# Patient Record
Sex: Male | Born: 1950 | Race: White | Hispanic: No | Marital: Married | Smoking: Never smoker
Health system: Southern US, Community
[De-identification: ages and names within clinical notes are randomized; demographics above are authoritative.]

## PROBLEM LIST (undated history)

## (undated) DIAGNOSIS — G039 Meningitis, unspecified: Secondary | ICD-10-CM

## (undated) DIAGNOSIS — I679 Cerebrovascular disease, unspecified: Secondary | ICD-10-CM

## (undated) DIAGNOSIS — Z860101 Personal history of adenomatous and serrated colon polyps: Secondary | ICD-10-CM

## (undated) DIAGNOSIS — Z86010 Personal history of colonic polyps: Secondary | ICD-10-CM

## (undated) DIAGNOSIS — R6884 Jaw pain: Secondary | ICD-10-CM

## (undated) DIAGNOSIS — E785 Hyperlipidemia, unspecified: Secondary | ICD-10-CM

## (undated) DIAGNOSIS — H9190 Unspecified hearing loss, unspecified ear: Secondary | ICD-10-CM

## (undated) DIAGNOSIS — E669 Obesity, unspecified: Secondary | ICD-10-CM

## (undated) DIAGNOSIS — E119 Type 2 diabetes mellitus without complications: Secondary | ICD-10-CM

## (undated) DIAGNOSIS — I10 Essential (primary) hypertension: Secondary | ICD-10-CM

## (undated) DIAGNOSIS — M47816 Spondylosis without myelopathy or radiculopathy, lumbar region: Secondary | ICD-10-CM

## (undated) HISTORY — DX: Obesity, unspecified: E66.9

## (undated) HISTORY — DX: Unspecified hearing loss, unspecified ear: H91.90

## (undated) HISTORY — DX: Meningitis, unspecified: G03.9

## (undated) HISTORY — DX: Personal history of adenomatous and serrated colon polyps: Z86.0101

## (undated) HISTORY — PX: OTHER SURGICAL HISTORY: SHX169

## (undated) HISTORY — DX: Essential (primary) hypertension: I10

## (undated) HISTORY — DX: Jaw pain: R68.84

## (undated) HISTORY — DX: Cerebrovascular disease, unspecified: I67.9

## (undated) HISTORY — DX: Hyperlipidemia, unspecified: E78.5

## (undated) HISTORY — DX: Personal history of colonic polyps: Z86.010

## (undated) HISTORY — DX: Spondylosis without myelopathy or radiculopathy, lumbar region: M47.816

## (undated) HISTORY — PX: BACK SURGERY: SHX140

## (undated) HISTORY — PX: COLONOSCOPY: SHX5424

---
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
## 1990-09-05 HISTORY — PX: LUMBAR FUSION: SHX111

## 2001-03-02 ENCOUNTER — Encounter: Admission: RE | Admit: 2001-03-02 | Discharge: 2001-03-02 | Payer: Self-pay | Admitting: Family Medicine

## 2001-03-02 ENCOUNTER — Encounter: Payer: Self-pay | Admitting: Family Medicine

## 2001-04-03 ENCOUNTER — Ambulatory Visit (HOSPITAL_COMMUNITY): Admission: RE | Admit: 2001-04-03 | Discharge: 2001-04-03 | Payer: Self-pay | Admitting: Gastroenterology

## 2001-04-03 ENCOUNTER — Encounter (INDEPENDENT_AMBULATORY_CARE_PROVIDER_SITE_OTHER): Payer: Self-pay | Admitting: Specialist

## 2005-01-07 ENCOUNTER — Ambulatory Visit (HOSPITAL_COMMUNITY): Admission: RE | Admit: 2005-01-07 | Discharge: 2005-01-07 | Payer: Self-pay | Admitting: Ophthalmology

## 2005-01-07 ENCOUNTER — Ambulatory Visit (HOSPITAL_BASED_OUTPATIENT_CLINIC_OR_DEPARTMENT_OTHER): Admission: RE | Admit: 2005-01-07 | Discharge: 2005-01-07 | Payer: Self-pay | Admitting: Ophthalmology

## 2005-03-03 ENCOUNTER — Ambulatory Visit (HOSPITAL_BASED_OUTPATIENT_CLINIC_OR_DEPARTMENT_OTHER): Admission: RE | Admit: 2005-03-03 | Discharge: 2005-03-03 | Payer: Self-pay | Admitting: Ophthalmology

## 2005-03-03 ENCOUNTER — Ambulatory Visit (HOSPITAL_COMMUNITY): Admission: RE | Admit: 2005-03-03 | Discharge: 2005-03-03 | Payer: Self-pay | Admitting: Ophthalmology

## 2007-02-05 ENCOUNTER — Ambulatory Visit (HOSPITAL_COMMUNITY): Admission: RE | Admit: 2007-02-05 | Discharge: 2007-02-05 | Payer: Self-pay | Admitting: Family Medicine

## 2009-04-17 HISTORY — PX: LUMBAR FUSION: SHX111

## 2011-01-21 NOTE — Op Note (Signed)
[name], [name] NO.:  [id number]   MEDICAL RECORD NO.:  [id number]          PATIENT TYPE:  AMB   LOCATION:  DSC                          FACILITY:  [facility]   PHYSICIAN:  Pasty Spillers. Maple Hudson, M.D. 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   PROCEDURE:  1.  Left medial rectus muscle recession, 5.0 mm, with full tendon width up      shift.  2.  Left lateral rectus muscle resection, 4.0 mm, with full tendon width up      shift.   SURGEON:  Pasty Spillers. Maple Hudson, M.D.   ANESTHESIA:  General (laryngeal mask).   COMPLICATIONS:  None.   PROCEDURE:  After preop evaluation including informed consent, the patient  was taken the operating room where he was identified by me. General  anesthesia was induced without difficulty after placement of appropriate  monitors. The the patient was prepped and draped in standard sterile  fashion. Lid speculum was placed in the left eye. Conjunctiva was inspected  and did not show signs of scarring from previous surgery. Thus it was  concluded that the previous surgery had (as the patient believed) been  confined to the right eye.   Through an inferonasal fornix incision through conjunctiva and Tenon's  fascia, right medial rectus muscle was engaged on a series of muscle hooks  and cleared of its fascial attachments. The tendon was secured with a double-  arm 6-0 Vicryl suture, with a double-locking bite at each border of the  muscle, 1 mm from the insertion. The muscle was disinserted. Its inferior  pole was reattached to the sclera 5.0 mm posterior to the superior pole of  the muscle stump. The superior pole was  reattached to sclera approximately 8  mm above the inferior pole. Both sutures were attached in crossed swords  fashion and tied securely. The sutures ends were removed after the position  of muscle had been checked and found to be accurate. The conjunctiva was  closed with two interrupted 6-0 plain gut sutures. Through an inferonasal  temporal fornix incision through conjunctiva and Tenon's fascia, the left  lateral rectus muscle was engaged on a series of muscle hooks carefully  cleared of its fascial attachments. The muscle was spread between two self-  retaining hooks. A 2 mm bite was taken of the center of muscle belly.  It  measured distance of 4.0 mm posterior to the insertion, the knot was tied  securely at this location. The needle at each end of the double-arm suture  was passed from the center of muscle belly to the periphery, parallel to and  4.0 mm posterior to the insertion. Double-locking bite was placed at each  border of the muscle. A resection clamp was placed on the muscle just  anterior to the suture. The muscle was disinserted. The inferior pole suture  was passed posteriorly to anteriorly through the superior end of the muscle  stump, then anteriorly to posteriorly approximately 3 mm beyond the end of  the stump, then posteriorly to anteriorly through the center of the muscle  belly, just posterior to the previously placed knot. The superior pole of  the suture was similarly passed, beginning approximately 8 mm above the  extended end of the original muscle stump. The muscle was drawn up to the  level of the original insertion, and all slack was removed in both the pole  sutures before the suture ends were tied securely. The portion of the muscle  anterior to the sutures was carefully excised after the resection clamp had  been removed. Conjunctiva was closed with two 6-0 plain gut sutures.  TobraDex ointment was placed in the eye. The patient was awaken without   difficulty and taken to recovery room in stable condition, having suffered  no intraoperative or immediate postop complications.      WOY/MEDQ  D:  01/07/2005  T:  01/07/2005  Job:  [id number]   cc:   Loraine Leriche T. Nile Riggs, M.D.  Fax: [phone]

## 2011-01-21 NOTE — Procedures (Signed)
[facility]  Patient:    [name]                          MRN: [id number] Proc. Date: 04/03/01 Adm. Date:  [id number] Attending:  Orland Mustard CC:         Teena Irani. Arlyce Dice, M.D.   Procedure Report  PROCEDURE:  Colonoscopy with coagulation of polyp.  SURGEON:  James L. Edwards, M.D.  MEDICATIONS:  Fentanyl 50 mcg and Versed 6 mg IV.  SCOPE:  Adult Olympus video colonoscope.  INDICATIONS:  Strong family history of colon cancer in a brother.  DESCRIPTION OF PROCEDURE:  The procedure had been explained to the patient and consent obtained.  With the patient in the left lateral decubitus position, the adult Olympus video colonoscope was inserted and advanced under direct visualization.  The prep was excellent.  We were able to advance easily to the cecum.  The scope was withdrawn and the cecum, ascending colon, hepatic flexure were seen well.  In the descending colon, a 3 mm polyp was seen, and was cauterized with the hot biopsy forceps.  There was no other polyp seen throughout the colon.  There was no significant diverticular disease.  The scope was withdrawn and there were some internal hemorrhoids seen in the rectum.  No other abnormalities were seen.  The patient tolerated the procedure and was maintained on low flow oxygen and pulse oximeter throughout the procedure.  ASSESSMENT: 1. Ascending colon polyp, cauterized. 2. Internal hemorrhoids.  PLAN:  Will check pathology and if adenomatous, would recommend repeating in three years.  If not, would recommend repeating in five years due to his family history. DD:  04/03/01 TD:  04/03/01 Job: [id number]

## 2011-01-21 NOTE — Op Note (Signed)
NAMEFREMON, ZACHARIA NO.:  [id number]   MEDICAL RECORD NO.:  [id number]          PATIENT TYPE:  AMB   LOCATION:  DSC                          FACILITY:  [facility]   PHYSICIAN:  Pasty Spillers. Maple Hudson, M.D. DATE OF BIRTH:  19-Nov-1950   DATE OF PROCEDURE:  03/03/2005  DATE OF DISCHARGE:  03/03/2005                                 OPERATIVE REPORT   PREOPERATIVE DIAGNOSES:  1.  Consecutive exotropia, following left medial rectus muscle recession and      left lateral rectus muscle resection, with vertical offset for      hypertropia.  2.  Status post four previous eye muscle surgeries, right eye, details      unknown.   POSTOPERATIVE DIAGNOSES:  1.  Consecutive exotropia, following left medial rectus muscle recession and      left lateral rectus muscle resection, with vertical offset for      hypertropia.  2.  Status post four previous eye muscle surgeries, right eye, details      unknown.   OPERATION PERFORMED:  1.  Exploration of right lateral rectus muscle.  2.  Exploration of right medial rectus muscle.  3.  Repair of split right medial rectus muscle.  4.  Right medial rectus muscle resection, 8.0 mm, with 3.0 mm adjustable      recession.   SURGEON:  Pasty Spillers. Maple Hudson, M.D.   ANESTHESIA:  General laryngeal mask.   COMPLICATIONS:  None.   DESCRIPTION OF PROCEDURE:  After routine preoperative evaluation including  informed consent for eye muscle surgery on the right eye, the patient was  taken to the operating room where he was identified by me.  General  anesthesia was induced without difficulty after placement of appropriate  monitors.  The patient was prepped and draped in standard sterile fashion.  A lid speculum was placed in the right eye.   A traction suture of 6-0 silk was placed at the superior and inferior limbus  of the right eye, and this was used to draw the eye nasally.  Limbal  conjunctival peritomy of 2 o'clock hours extent was made temporally  in the  right eye with Westcott scissors, with relaxing incisions in the  supratemporal and infratemporal quadrant.  Dissection was carried out into  the supratemporal and infratemporal quadrants.  Exploration of the right  lateral rectus muscle was carried out.  Extensive scar tissue was  encountered, and the right inferior oblique muscle was identified, but the  right lateral rectus muscle was not clearly identified, even after extensive  exploration posteriorly, superiorly and inferiorly.  There was concern that  the lateral rectus muscle might have been severed in the dissection through  scar tissue.  Therefore it was elected to awaken the patient enough to have  him follow commands, to verify that he could abduct the right eye,  indicating function of the right lateral rectus muscle.  This was done, and  the patient was in fact able to abduct the right eye.  Anesthesia was  deepened, and the patient was re-draped.  The conjunctiva of  the right eye  was closed with 6-0 plain gut sutures, after resecting an area of  conjunctival scar from previous surgeries, leaving the conjunctiva in effect  recessed approximately 5 mm posterior to the limbus.  Note again that no  surgery was carried out on the right lateral rectus muscle itself.  The eye  was then drawn temporally, and a limbal conjunctival peritomy was made  nasally to explore the right medial rectus muscle as the plan was now to  resect the medial rectus muscle as opposed to recessing the lateral.  The  medial rectus muscle was engaged on a series of muscle hooks and cleared of  its surrounding fascial attachments and scar tissue.  The inferior two  thirds of the muscle was found attached approximately 5 mm posterior to the  limbus; how the superior third of the muscle was found attached  approximately 10 mm posterior to the limbus.  This portion was carefully  explored, cleared to verify that it was in fact, part of the medial  rectus  muscle.  This portion itself was quite tight, and could not be advanced to  the original insertion without it causing excessive tension.  It was  therefore elected to leave this portion 5 mm posterior to the remainder of  the muscle, and to joint the two portions of the muscle with 6-0 Vicryl  sutures.  A knot was placed through the medial rectus muscles, joining the  two sections, 8 mm posterior to the insertion of the more anterior part,  i.e. 13 mm posterior to the limbus.  The needle at each end of the double  armed suture was passed from the center of the muscle belly to the  periphery, parallel to the insertion, again 13 mm posterior to the limbus.  Double locking bite was placed at each border of the muscle. A resection  clamp was placed just anterior to these sutures.  Both portions of the  medial rectus muscle were then disinserted.  Each pole suture of the medial  rectus muscle was passed into sclera in cross swords fashion at the level of  the original insertion (i.e. approximately 5 mm posterior to the limbus).  The muscle was drawn up to the level of the original insertion and the pole  sutures were tied together approximately 10 cm above sclera.  The pole  sutures were then clamped together with a needle driver at a measured  distance of 3 mm above sclera.  A noose suture was tied around the pole  sutures at this level.  The medial rectus muscle was then allowed to hang  back 3 mm, held at this level by the noose suture which was now in contact  with sclera.  Conjunctiva was reapposed with multiple 6-0 plain gut sutures,  leaving conjunctiva recessed approximately 5 mm posterior to the limbus.  The traction suture of 6-0 silk was removed from the superior and inferior  limbus and placed at the nasal limbus.  The pole, noose, and traction  sutures were taped to the right cheek.  Tobradex ointment was placed in the right eye.  The patch was placed over the right eye.   The patient was  awakened without difficulty and taken to the recovery room in stable  condition, having suffered no intraoperative or immediate postoperative  complications.       WOY/MEDQ  D:  03/07/2005  T:  03/07/2005  Job:  [id number]

## 2011-08-05 ENCOUNTER — Telehealth: Payer: Self-pay | Admitting: Nurse Practitioner

## 2011-08-05 ENCOUNTER — Encounter: Payer: Self-pay | Admitting: Nurse Practitioner

## 2011-08-08 ENCOUNTER — Ambulatory Visit (INDEPENDENT_AMBULATORY_CARE_PROVIDER_SITE_OTHER): Payer: BC Managed Care – PPO | Admitting: Nurse Practitioner

## 2011-08-08 ENCOUNTER — Encounter: Payer: Self-pay | Admitting: Nurse Practitioner

## 2011-08-08 DIAGNOSIS — R079 Chest pain, unspecified: Secondary | ICD-10-CM

## 2011-08-08 DIAGNOSIS — Z9189 Other specified personal risk factors, not elsewhere classified: Secondary | ICD-10-CM

## 2011-08-08 DIAGNOSIS — R6884 Jaw pain: Secondary | ICD-10-CM | POA: Insufficient documentation

## 2011-08-08 NOTE — Patient Instructions (Addendum)
We are going to arrange for a stress test.   Exercise/diet/weight loss is encouraged.  Call the [facility] office at [phone] if you have any questions, problems or concerns.   I will have Dr. Peter Swaziland be your cardiologist.  We will see you back as needed.

## 2011-08-08 NOTE — Assessment & Plan Note (Signed)
He has multiple CV risk factors which include gender, family history, HTN, DM, and hyperlipidemia. I have encouraged him to try and increase his exercise program to 5 to 7 days per week. I think he would benefit greatly from weight loss. His blood pressure is currently controlled. We will be available as needed. I will have Dr. Swaziland follow him for cardiology care. Patient is agreeable to this plan and will call if any problems develop in the interim.

## 2011-08-08 NOTE — Assessment & Plan Note (Signed)
He is not having any exertional symptoms. EKG is normal. Does have multiple CV risk factors. We will proceed with repeat stress testing.

## 2011-08-08 NOTE — Progress Notes (Signed)
   Larwance Rote Date of Birth: [birth year] Medical Record #[id number]  History of Present Illness: Jonni Sanger is seen today at the request of Dr. Izola Price. He is a former patient of Dr. Ronnald Nian. He has had remote stress testing. His cardiac risk factors include gender, family history, HTN, hyperlipidemia and diabetes. He is obese. BMI is 32. He reports having maybe 2 or 3 episodes of chest burning. It happens about once a month. It is not exertional in nature. He notes that it comes on with stress and anxiety. No aggravating factors. No relieving factors. No other associated symptoms i.e. SOB, N, V, sweating or radiation. He does work out at [employer] about 2 to 3 x per week without any problems. His last stress test was about 2 years ago.   Current Outpatient Prescriptions on File Prior to Visit  Medication Sig Dispense Refill  . amLODipine (NORVASC) 5 MG tablet Take 5 mg by mouth daily.        Marland Kitchen aspirin 81 MG tablet Take 81 mg by mouth daily.        . cholecalciferol (VITAMIN D) 1000 UNITS tablet Take 1,000 Units by mouth daily.        . Multiple Vitamin (MULTIVITAMIN) tablet Take 1 tablet by mouth daily.        . S-Adenosylmethionine (SAM-E) 400 MG TABS Take 1 tablet by mouth daily.        . simvastatin (ZOCOR) 20 MG tablet Take 20 mg by mouth at bedtime.          No Known Allergies  Past Medical History  Diagnosis Date  . Hyperlipidemia   . Hypertension   . Vitamin D deficiency   . Prediabetes   . History of adenomatous polyp of colon   . Obesity   . Lumbar spondylosis   . Decreased hearing     Past Surgical History  Procedure Date  . Back surgery   . Lumbar fusion 04/17/2009    L4-L5  . Lumbar fusion 1992    L4-L5  . Hernia repair 1962  . Eye muscle repair   . Colonoscopy 07/21/2004, 04/03/2010    History  Smoking status  . Never Smoker   Smokeless tobacco  . Not on file    History  Alcohol Use No    Family History  Problem Relation Age of Onset  . Heart failure Mother    . Hypertension Mother   . Hypertension Father   . Breast cancer Sister   . Prostate cancer Brother   . Heart attack Brother 59  . Heart failure Maternal Grandmother   . Heart failure Maternal Grandfather   . Heart failure Paternal Grandmother   . Heart failure Paternal Grandfather     Review of Systems: The review of systems is per the HPI.  All other systems were reviewed and are negative.  Physical Exam: BP 122/82  Pulse 62  Ht 6' (1.829 m)  Wt 238 lb 12.8 oz (108.319 kg)  BMI 32.39 kg/m2 Patient is very pleasant and in no acute distress. He is overweight. Skin is warm and dry. Color is normal.  HEENT is unremarkable. Normocephalic/atraumatic. PERRL. Sclera are nonicteric. Neck is supple. No masses. No JVD. Lungs are clear. Cardiac exam shows a regular rate and rhythm. Abdomen is obese but soft. Extremities are without edema. Gait and ROM are intact. No gross neurologic deficits noted.   LABORATORY DATA: EKG from his primary care is normal.    Assessment / Plan:

## 2011-08-11 ENCOUNTER — Ambulatory Visit (HOSPITAL_COMMUNITY): Payer: BC Managed Care – PPO | Attending: Internal Medicine | Admitting: Radiology

## 2011-08-11 DIAGNOSIS — R079 Chest pain, unspecified: Secondary | ICD-10-CM

## 2011-08-11 DIAGNOSIS — E119 Type 2 diabetes mellitus without complications: Secondary | ICD-10-CM | POA: Insufficient documentation

## 2011-08-11 DIAGNOSIS — Z9189 Other specified personal risk factors, not elsewhere classified: Secondary | ICD-10-CM

## 2011-08-11 DIAGNOSIS — R0789 Other chest pain: Secondary | ICD-10-CM | POA: Insufficient documentation

## 2011-08-11 DIAGNOSIS — Z8249 Family history of ischemic heart disease and other diseases of the circulatory system: Secondary | ICD-10-CM | POA: Insufficient documentation

## 2011-08-11 DIAGNOSIS — R0602 Shortness of breath: Secondary | ICD-10-CM

## 2011-08-11 DIAGNOSIS — R42 Dizziness and giddiness: Secondary | ICD-10-CM | POA: Insufficient documentation

## 2011-08-11 DIAGNOSIS — I10 Essential (primary) hypertension: Secondary | ICD-10-CM | POA: Insufficient documentation

## 2011-08-11 DIAGNOSIS — E785 Hyperlipidemia, unspecified: Secondary | ICD-10-CM | POA: Insufficient documentation

## 2011-08-11 MED ORDER — TECHNETIUM TC 99M TETROFOSMIN IV KIT
33.0000 | PACK | Freq: Once | INTRAVENOUS | Status: AC | PRN
  Administered 2011-08-11: 33 via INTRAVENOUS

## 2011-08-11 MED ORDER — TECHNETIUM TC 99M TETROFOSMIN IV KIT
11.0000 | PACK | Freq: Once | INTRAVENOUS | Status: AC | PRN
  Administered 2011-08-11: 11 via INTRAVENOUS

## 2011-08-11 NOTE — Progress Notes (Signed)
[facility] SITE 3 NUCLEAR MED [address] [place] [address] [phone]  Cardiology Nuclear Med Study  [name] is a 60 y.o. male [id number] [birth year]   Nuclear Med Background Indication for Stress Test:  Evaluation for Ischemia History:  No previous documented CAD and 2 yrs ago Myocardial Perfusion Study: NL (Dr. Deborah Chalk, unable to locate [facility] chart and report) Cardiac Risk Factors: Family History - CAD, Hypertension, Lipids and NIDDM  Symptoms:  Chest Pain (burning) with and without  Exertion (last date of chest discomfort one week ago) and Dizziness   Nuclear Pre-Procedure Caffeine/Decaff Intake:  8:00pm NPO After: 8:00pm   Lungs:  Clear IV 0.9% NS with Angio Cath:  20g  IV Site: R Forearm  IV Started by:  Cathlyn Parsons, RN  Chest Size (in):  46 Cup Size: n/a  Height: 6' (1.829 m)  Weight:  234 lb (106.142 kg)  BMI:  Body mass index is 31.74 kg/(m^2). Tech Comments:  n/a    Nuclear Med Study 1 or 2 day study: 1 day  Stress Test Type:  Stress  Reading MD: Dietrich Pates, MD  Order Authorizing Provider:  Vonna Drafts and Sunday Spillers  Resting Radionuclide: Technetium 13m Tetrofosmin  Resting Radionuclide Dose: 11.0 mCi   Stress Radionuclide:  Technetium 59m Tetrofosmin  Stress Radionuclide Dose: 33.0 mCi           Stress Protocol Rest HR: 74 Stress HR: 144  Rest BP: 112/91 Stress BP: 171/78  Exercise Time (min): 8:45 METS: 10.1   Predicted Max HR: 159 bpm % Max HR: 90.57 bpm Rate Pressure Product: 57846   Dose of Adenosine (mg):  n/a Dose of Lexiscan: n/a mg  Dose of Atropine (mg): n/a Dose of Dobutamine: n/a mcg/kg/min (at max HR)  Stress Test Technologist: Irean Hong, RN  Nuclear Technologist:  Domenic Polite, CNMT     Rest Procedure:  Myocardial perfusion imaging was performed at rest 45 minutes following the intravenous administration of Technetium 99m Tetrofosmin. Rest ECG: NSR with poor R wave progression  Stress  Procedure:  The patient exercised for 8 minutes and 45 seconds, RPE=15.  The patient stopped due to DOE and denied any chest pain.  There were no significant ST-T wave changes. There was a drop in BP to 139/77 immediately post exercise. Technetium 63m Tetrofosmin was injected at peak exercise and myocardial perfusion imaging was performed after a brief delay. Stress ECG: No significant change from baseline ECG  QPS Raw Data Images:  Soft tissue (diaphragm, subcutaneous fat) surround heart.  Stress and rest images were motion corrected. Stress Images:  Normal homogeneous uptake in all areas of the myocardium. Rest Images:  Normal homogeneous uptake in all areas of the myocardium. Subtraction (SDS):  No evidence of ischemia. Transient Ischemic Dilatation (Normal <1.22):  1.05 Lung/Heart Ratio (Normal <0.45):  0.32  Quantitative Gated Spect Images QGS EDV:  98 ml QGS ESV:  37 ml QGS cine images:  NL LV Function; NL Wall Motion QGS EF: 62%  Impression Exercise Capacity:  Good exercise capacity. BP Response:  Normal blood pressure response. Clinical Symptoms:  No chest pain. ECG Impression:  No significant ST segment change suggestive of ischemia. Comparison with Prior Nuclear Study:No change from previous report.  Overall Impression:  Normal stress nuclear study.  LVEF 62%. Dietrich Pates

## 2011-08-12 ENCOUNTER — Telehealth: Payer: Self-pay | Admitting: *Deleted

## 2011-08-12 NOTE — Telephone Encounter (Signed)
Spoke w/wife and gave stress test results. Will send copy to Dr. Izola Price at [place] [facility]

## 2011-08-12 NOTE — Telephone Encounter (Signed)
Message copied by Lorayne Bender on Fri Aug 12, 2011  5:46 PM ------      Message from: [place], PETER M      Created: Fri Aug 12, 2011  1:42 PM       Normal stress myoview. EF is normal. Please report.      Theron Arista [place]

## 2011-12-09 NOTE — Telephone Encounter (Signed)
Close  

## 2013-05-21 DIAGNOSIS — E782 Mixed hyperlipidemia: Secondary | ICD-10-CM | POA: Insufficient documentation

## 2013-05-21 DIAGNOSIS — F411 Generalized anxiety disorder: Secondary | ICD-10-CM

## 2013-05-21 DIAGNOSIS — I10 Essential (primary) hypertension: Secondary | ICD-10-CM

## 2013-05-21 HISTORY — DX: Generalized anxiety disorder: F41.1

## 2013-05-21 HISTORY — DX: Mixed hyperlipidemia: E78.2

## 2013-05-21 HISTORY — DX: Essential (primary) hypertension: I10

## 2014-04-07 DIAGNOSIS — E119 Type 2 diabetes mellitus without complications: Secondary | ICD-10-CM | POA: Insufficient documentation

## 2014-04-07 HISTORY — DX: Type 2 diabetes mellitus without complications: E11.9

## 2014-12-24 ENCOUNTER — Encounter: Payer: Self-pay | Admitting: Interventional Cardiology

## 2014-12-24 ENCOUNTER — Ambulatory Visit (INDEPENDENT_AMBULATORY_CARE_PROVIDER_SITE_OTHER): Payer: BLUE CROSS/BLUE SHIELD | Admitting: Interventional Cardiology

## 2014-12-24 VITALS — BP 118/70 | HR 61 | Ht 71.0 in | Wt 233.2 lb

## 2014-12-24 DIAGNOSIS — R9431 Abnormal electrocardiogram [ECG] [EKG]: Secondary | ICD-10-CM | POA: Insufficient documentation

## 2014-12-24 DIAGNOSIS — E1129 Type 2 diabetes mellitus with other diabetic kidney complication: Secondary | ICD-10-CM | POA: Diagnosis not present

## 2014-12-24 DIAGNOSIS — R6884 Jaw pain: Secondary | ICD-10-CM | POA: Diagnosis not present

## 2014-12-24 DIAGNOSIS — E785 Hyperlipidemia, unspecified: Secondary | ICD-10-CM | POA: Diagnosis not present

## 2014-12-24 HISTORY — DX: Abnormal electrocardiogram (ECG) (EKG): R94.31

## 2014-12-24 NOTE — Patient Instructions (Signed)
Medication Instructions:  Your physician recommends that you continue on your current medications as directed. Please refer to the Current Medication list given to you today.   Labwork: None   Testing/Procedures: Your physician has requested that you have an exercise tolerance test. For further information please visit [email or web address]. Please also follow instruction sheet, as given.   Follow-Up: Your physician recommends that you schedule a follow-up appointment pending test results   Any Other Special Instructions Will Be Listed Below (If Applicable).

## 2014-12-24 NOTE — Progress Notes (Signed)
Cardiology Office Note   Date:  12/24/2014   ID:  [name], DOB 1951/06/04, MRN [id number]  PCP:  Brian Melter, MD  Cardiologist:   Brian Grooms, MD   Chief Complaint  Patient presents with  . Abnormal ECG      History of Present Illness: [name] is a 64 y.o. 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  He is under a lot of stress that his business related. He denies dyspnea, chest pain, and other ischemic complaints. He has low back discomfort that prevents walking Alf distances.    Past Medical History  Diagnosis Date  . Hyperlipidemia   . Hypertension   . Vitamin D deficiency   . Prediabetes   . History of adenomatous polyp of colon   . Obesity   . Lumbar spondylosis   . Decreased hearing     Past Surgical History  Procedure Laterality Date  . Back surgery    . Lumbar fusion  04/17/2009    L4-L5  . Lumbar fusion  1992    L4-L5  . Hernia repair  1962  . Eye muscle repair    . Colonoscopy  07/21/2004, 04/03/2010     Current Outpatient Prescriptions  Medication Sig Dispense Refill  . amLODipine (NORVASC) 5 MG tablet Take 5 mg by mouth daily.      Marland Kitchen aspirin 81 MG tablet Take 81 mg by mouth daily.      . cholecalciferol (VITAMIN D) 1000 UNITS tablet Take 1,000 Units by mouth daily.      . Multiple Vitamin (MULTIVITAMIN) tablet Take 1 tablet by mouth daily.      . S-Adenosylmethionine (SAM-E) 400 MG TABS Take 1 tablet by mouth daily.      . simvastatin (ZOCOR) 20 MG tablet Take 20 mg by mouth daily.      No current facility-administered medications for this visit.    Allergies:   Review of patient's allergies indicates no known allergies.    Social History:  The patient  reports that he has never smoked. He does not have any smokeless tobacco history on file. He reports that he drinks alcohol. He reports that he  does not use illicit drugs.   Family History:  The patient's family history includes Breast cancer in his sister; Heart attack (age of onset: 81) in his brother; Heart failure in his maternal grandfather, maternal grandmother, mother, paternal grandfather, and paternal grandmother; Hypertension in his father and mother; Prostate cancer in his brother.    ROS:  Please see the history of present illness.   Otherwise, review of systems are positive for low back surgery and difficulty walking due to back pain if he walks beyond 1-1/2 miles..   All other systems are reviewed and negative.    PHYSICAL EXAM: VS:  BP 118/70 mmHg  Pulse 61  Ht 5\' 11"  (1.803 m)  Wt 233 lb 3.2 oz (105.779 kg)  BMI 32.54 kg/m2 , BMI Body mass index is 32.54 kg/(m^2). GEN: Well nourished, well developed, in no acute distress HEENT: normal Neck: no JVD, carotid bruits, or masses Cardiac: RRR; no murmurs, rubs, or gallops,no edema  Respiratory:  clear to auscultation bilaterally, normal work of breathing GI: soft, nontender, nondistended, + BS MS: no deformity or atrophy Skin: warm and dry, no rash Neuro:  Strength and sensation are intact Psych: euthymic mood, full affect   EKG:  EKG  is ordered today. The ekg ordered today demonstrates poor R-wave progression V1 through 4. No T-wave abnormality noted.   Recent Labs: No results found for requested labs within last 365 days.    Lipid Panel No results found for: CHOL, TRIG, HDL, CHOLHDL, VLDL, LDLCALC, LDLDIRECT    Wt Readings from Last 3 Encounters:  12/24/14 233 lb 3.2 oz (105.779 kg)  08/11/11 234 lb (106.142 kg)  08/08/11 238 lb 12.8 oz (108.319 kg)      Other studies Reviewed: Additional studies/ records that were reviewed today include: Reviewed [facility] records as well as records from Dr [name]. Review of the above records demonstrates: EKG done last month is normal   ASSESSMENT AND PLAN:  Abnormal ECG - early repolarization  and poor R-wave progression in a diabetic. Likely related to lead position. We will perform an excess treadmill test. Myocardial imaging is not required.  Type 2 diabetes mellitus with other diabetic kidney complication  Hyperlipidemia, on therapy  Jaw pain, patient denies this even though it is discussed and [name] note. He has had some numbness and tingling in his arms.      Current medicines are reviewed at length with the patient today.  The patient has concerns regarding medicines.  The following changes have been made:  no change  Labs/ tests ordered today include:   Orders Placed This Encounter  Procedures  . EKG 12-Lead  . Exercise tolerance test     Disposition:   FU with [name] PRN  Signed, Brian Grooms, MD  12/24/2014 10:05 AM    [facility] [place], [place], [place]  [address] Phone: [phone]; Fax: [phone]

## 2015-02-04 ENCOUNTER — Encounter: Payer: BLUE CROSS/BLUE SHIELD | Admitting: Physician Assistant

## 2015-02-06 ENCOUNTER — Ambulatory Visit (HOSPITAL_COMMUNITY)
Admission: RE | Admit: 2015-02-06 | Discharge: 2015-02-06 | Disposition: A | Discharge status: Home / Self Care | Payer: BLUE CROSS/BLUE SHIELD | Source: Ambulatory Visit | Attending: Cardiovascular Disease | Admitting: Cardiovascular Disease

## 2015-02-06 ENCOUNTER — Other Ambulatory Visit: Payer: Self-pay | Admitting: Interventional Cardiology

## 2015-02-06 DIAGNOSIS — R9431 Abnormal electrocardiogram [ECG] [EKG]: Secondary | ICD-10-CM | POA: Diagnosis not present

## 2015-02-06 LAB — EXERCISE TOLERANCE TEST
CHL CUP MPHR: 156 {beats}/min
CHL CUP STRESS STAGE 1 DBP: 96 mmHg
CHL CUP STRESS STAGE 1 GRADE: 0 %
CHL CUP STRESS STAGE 3 GRADE: 0 %
CHL CUP STRESS STAGE 3 SPEED: 1 mph
CHL CUP STRESS STAGE 4 HR: 112 {beats}/min
CHL CUP STRESS STAGE 4 SBP: 151 mmHg
CHL CUP STRESS STAGE 5 GRADE: 12 %
CHL CUP STRESS STAGE 6 GRADE: 14 %
CHL CUP STRESS STAGE 6 SPEED: 3.4 mph
CHL CUP STRESS STAGE 7 HR: 162 {beats}/min
CHL CUP STRESS STAGE 7 SPEED: 4.2 mph
CHL CUP STRESS STAGE 8 GRADE: 0 %
CHL CUP STRESS STAGE 8 SPEED: 0 mph
CHL CUP STRESS STAGE 9 DBP: 85 mmHg
CHL CUP STRESS STAGE 9 GRADE: 0 %
CHL CUP STRESS STAGE 9 HR: 102 {beats}/min
CSEPED: 10 min
CSEPHR: 103 %
Estimated workload: 11.7 METS
Peak HR: 162 {beats}/min
Percent of predicted max HR: 103 %
RPE: 26426
Rest HR: 96 {beats}/min
Stage 1 HR: 93 {beats}/min
Stage 1 SBP: 131 mmHg
Stage 1 Speed: 0 mph
Stage 2 Grade: 0 %
Stage 2 HR: 93 {beats}/min
Stage 2 Speed: 1 mph
Stage 3 HR: 93 {beats}/min
Stage 4 DBP: 83 mmHg
Stage 4 Grade: 10 %
Stage 4 Speed: 1.7 mph
Stage 5 DBP: 86 mmHg
Stage 5 HR: 126 {beats}/min
Stage 5 SBP: 162 mmHg
Stage 5 Speed: 2.5 mph
Stage 6 DBP: 92 mmHg
Stage 6 HR: 146 {beats}/min
Stage 6 SBP: 181 mmHg
Stage 7 Grade: 16 %
Stage 8 DBP: 84 mmHg
Stage 8 HR: 141 {beats}/min
Stage 8 SBP: 186 mmHg
Stage 9 SBP: 153 mmHg
Stage 9 Speed: 0 mph

## 2015-02-09 ENCOUNTER — Telehealth: Payer: Self-pay

## 2015-02-09 NOTE — Telephone Encounter (Signed)
Pt aware of gxt results with verbal understanding. Test was normal. No further w/u at this time.

## 2015-02-09 NOTE — Telephone Encounter (Signed)
-----   Message from Belva Crome, MD sent at 02/06/2015  5:44 PM EDT ----- Test was normal. No further w/u at this time.

## 2015-10-01 ENCOUNTER — Telehealth: Payer: Self-pay | Admitting: Interventional Cardiology

## 2015-10-01 NOTE — Telephone Encounter (Signed)
Returned pt wife call. Pt has been having increased DOE for 4-6 wks. Pt denies chest pain, sob rest, palpitations, swelling, orthopnea, nausea, vomiting. Pt is complaining of feeling bloated at times. Pt previous cardiac work up was negative. Pt wife would like to for him to have an echo. Adv her that Dr.Smith is out of the office. I will discus pt symptoms with another provider and call back with their recommendation. Pt wife voiced appreciation.   Adv pt that that I have discussed pt symptoms with Desiree Hane who recommends pt come in for eval. Per Isaac Laud ok to schedule a available flex appt. Offered pt wife an appt with Sandria Senter flex for tomorrow @ 3pm. Pt wife agreeable and verbalized understanding.

## 2015-10-01 NOTE — Telephone Encounter (Signed)
[name] is calling [name] have been feeling bloated and shortness of breath .

## 2015-10-02 ENCOUNTER — Encounter: Payer: Self-pay | Admitting: Cardiology

## 2015-10-02 ENCOUNTER — Ambulatory Visit (INDEPENDENT_AMBULATORY_CARE_PROVIDER_SITE_OTHER): Payer: Managed Care, Other (non HMO) | Admitting: Cardiology

## 2015-10-02 VITALS — BP 115/78 | HR 76 | Ht 71.0 in | Wt 237.0 lb

## 2015-10-02 DIAGNOSIS — R0600 Dyspnea, unspecified: Secondary | ICD-10-CM

## 2015-10-02 DIAGNOSIS — I10 Essential (primary) hypertension: Secondary | ICD-10-CM | POA: Diagnosis not present

## 2015-10-02 DIAGNOSIS — E1129 Type 2 diabetes mellitus with other diabetic kidney complication: Secondary | ICD-10-CM | POA: Diagnosis not present

## 2015-10-02 DIAGNOSIS — E785 Hyperlipidemia, unspecified: Secondary | ICD-10-CM | POA: Diagnosis not present

## 2015-10-02 LAB — CBC WITH DIFFERENTIAL/PLATELET
BASOS ABS: 0.1 10*3/uL (ref 0.0–0.1)
Basophils Relative: 1 % (ref 0–1)
EOS PCT: 8 % — AB (ref 0–5)
Eosinophils Absolute: 0.7 10*3/uL (ref 0.0–0.7)
HCT: 42.3 % (ref 39.0–52.0)
Hemoglobin: 14.7 g/dL (ref 13.0–17.0)
LYMPHS ABS: 1.3 10*3/uL (ref 0.7–4.0)
LYMPHS PCT: 16 % (ref 12–46)
MCH: 30.1 pg (ref 26.0–34.0)
MCHC: 34.8 g/dL (ref 30.0–36.0)
MCV: 86.7 fL (ref 78.0–100.0)
MPV: 8.7 fL (ref 8.6–12.4)
Monocytes Absolute: 1.1 10*3/uL — ABNORMAL HIGH (ref 0.1–1.0)
Monocytes Relative: 14 % — ABNORMAL HIGH (ref 3–12)
Neutro Abs: 5 10*3/uL (ref 1.7–7.7)
Neutrophils Relative %: 61 % (ref 43–77)
Platelets: 269 10*3/uL (ref 150–400)
RBC: 4.88 MIL/uL (ref 4.22–5.81)
RDW: 14.1 % (ref 11.5–15.5)
WBC: 8.2 10*3/uL (ref 4.0–10.5)

## 2015-10-02 LAB — BASIC METABOLIC PANEL
BUN: 14 mg/dL (ref 7–25)
CO2: 25 mmol/L (ref 20–31)
CREATININE: 1.07 mg/dL (ref 0.70–1.25)
Calcium: 9.2 mg/dL (ref 8.6–10.3)
Chloride: 101 mmol/L (ref 98–110)
Glucose, Bld: 120 mg/dL — ABNORMAL HIGH (ref 65–99)
Potassium: 4.3 mmol/L (ref 3.5–5.3)
Sodium: 135 mmol/L (ref 135–146)

## 2015-10-02 NOTE — Progress Notes (Signed)
Cardiology Office Note   Date:  10/02/2015   ID:  [name], DOB Jul 02, 1951, MRN [id number]  PCP:  Curly Rim, MD  Cardiologist:  Dr. Tamala Julian    Chief Complaint  Patient presents with  . Shortness of Breath  . stomach feeling bloated      History of Present Illness: [name] is a 65 y.o. male who presents for SOB  He has a history of HTN, hyperlipidemia and glucose intolerance and family hx CAD.  He had a stress test in 02/2015 negative for ischemia.    Over last month has had increased DOE, not severe enough to stop but obvious.  No SOB at night, has not had to prop up head to sleep.  He feels bloated in abd, none in lower ext.  His appetite has decreased as well.  Denies any chest pain.   Past Medical History  Diagnosis Date  . Hyperlipidemia   . Hypertension   . Vitamin D deficiency   . Prediabetes   . History of adenomatous polyp of colon   . Obesity   . Lumbar spondylosis   . Decreased hearing     Past Surgical History  Procedure Laterality Date  . Back surgery    . Lumbar fusion  04/17/2009    L4-L5  . Lumbar fusion  1992    L4-L5  . Hernia repair  1962  . Eye muscle repair    . Colonoscopy  07/21/2004, 04/03/2010     Current Outpatient Prescriptions  Medication Sig Dispense Refill  . amLODipine (NORVASC) 5 MG tablet Take 5 mg by mouth daily.      Marland Kitchen aspirin 81 MG tablet Take 81 mg by mouth daily.      . cholecalciferol (VITAMIN D) 1000 UNITS tablet Take 1,000 Units by mouth daily.      Marland Kitchen FLUoxetine (PROZAC) 10 MG capsule Take 1 capsule by mouth daily.    . metFORMIN (GLUCOPHAGE) 500 MG tablet Take 1 tablet by mouth 2 (two) times daily.    . Multiple Vitamin (MULTIVITAMIN) tablet Take 1 tablet by mouth daily.      . S-Adenosylmethionine (SAM-E) 400 MG TABS Take 1 tablet by mouth daily.      . simvastatin (ZOCOR) 20 MG tablet Take 20 mg by mouth daily.      No current facility-administered medications for this visit.    Allergies:   Review of  patient's allergies indicates no known allergies.    Social History:  The patient  reports that he has never smoked. He does not have any smokeless tobacco history on file. He reports that he drinks alcohol. He reports that he does not use illicit drugs.   Family History:  The patient's family history includes Breast cancer in his sister; Heart attack (age of onset: 13) in his brother; Heart failure in his maternal grandfather, maternal grandmother, mother, paternal grandfather, and paternal grandmother; Hypertension in his father and mother; Prostate cancer in his brother.    ROS:  General:no colds or fevers,  weight up 4 lbs since april Skin:no rashes or ulcers HEENT:no blurred vision, no congestion CV:see HPI PUL:see HPI GI:no diarrhea constipation or melena, no indigestion GU:no hematuria, no dysuria MS:no joint pain, no claudication Neuro:no syncope, no lightheadedness Endo:+ diabetes, no thyroid disease  Wt Readings from Last 3 Encounters:  10/02/15 237 lb (107.502 kg)  12/24/14 233 lb 3.2 oz (105.779 kg)  08/11/11 234 lb (106.142 kg)     PHYSICAL EXAM: VS:  BP 115/78 mmHg  Pulse 76  Ht 5\' 11"  (1.803 m)  Wt 237 lb (107.502 kg)  BMI 33.07 kg/m2 , BMI Body mass index is 33.07 kg/(m^2). General:Pleasant affect, NAD Skin:Warm and dry, brisk capillary refill HEENT:normocephalic, sclera clear, mucus membranes moist Neck:supple, no JVD, no bruits  Heart:S1S2 RRR without murmur, gallup, rub or click Lungs:clear without rales, rhonchi, or wheezes [id number], non tender, + BS, do not palpate liver spleen or masses Ext:no lower ext edema, 2+ pedal pulses, 2+ radial pulses Neuro:alert and oriented, MAE, follows commands, + facial symmetry    EKG:  EKG is ordered today. The ekg ordered today demonstrates SR with PACs similar to EKG 11/20/14.    Recent Labs: No results found for requested labs within last 365 days.    Lipid Panel No results found for: CHOL, TRIG, HDL,  CHOLHDL, VLDL, LDLCALC, LDLDIRECT     Other studies Reviewed: Additional studies/ records that were reviewed today include: previous notes, EKGs . ETT.   ASSESSMENT AND PLAN:  1.  SOB possible anginal equivalent. Recent ETT in June 2016 without ischemia discussed with Dr. Acie Fredrickson, will proceed with exercise myoview.  Will check echo as well.  Labs to include BNP if elevated will add lasix.   Follow up with Dr. Tamala Julian.  Other labs CBC, BMP.   2.  HTN controlled.   3. Hyperlipidemia followed by PCP  4. DM-2 on metformin      Current medicines are reviewed with the patient today.  The patient Has no concerns regarding medicines.  The following changes have been made:  See above Labs/ tests ordered today include:see above  Disposition:   FU:  see above  Lennie Muckle, NP  10/02/2015 5:09 PM    [facility] [place], [place], [place], [place] Phone: [phone]; Fax: [phone]

## 2015-10-02 NOTE — Patient Instructions (Signed)
Medication Instructions:  Your physician recommends that you continue on your current medications as directed. Please refer to the Current Medication list given to you today.  Labwork: Your physician recommends that you have lab work today: bmet, bnp and cbc   Testing/Procedures: Your physician has requested that you have en exercise stress myoview. For further information please visit [email or web address]. Please follow instruction sheet, as given.  Your physician has requested that you have an echocardiogram. Echocardiography is a painless test that uses sound waves to create images of your heart. It provides your doctor with information about the size and shape of your heart and how well your heart's chambers and valves are working. This procedure takes approximately one hour. There are no restrictions for this procedure.  Follow-Up: Your physician wants you to follow-up with Dr. Tamala Julian after all test are complete.   If you need a refill on your cardiac medications before your next appointment, please call your pharmacy.

## 2015-10-03 LAB — BRAIN NATRIURETIC PEPTIDE: Brain Natriuretic Peptide: 17.9 pg/mL (ref 0.0–100.0)

## 2015-10-06 ENCOUNTER — Telehealth: Payer: Self-pay | Admitting: Interventional Cardiology

## 2015-10-06 NOTE — Telephone Encounter (Signed)
Received mobile number from patient's wife. Called patient with lab results. Per Cecilie Kicks NP, labs are stable. Confirmed echo and stress test appointment on 10/16/2015. Patient did give verbal permission to speak with his wife Dru Gettis.

## 2015-10-06 NOTE — Telephone Encounter (Signed)
Patient aware of lab results.

## 2015-10-06 NOTE — Telephone Encounter (Signed)
Returning your call. °

## 2015-10-06 NOTE — Telephone Encounter (Signed)
Returning call from yesterday,pt saw Mickel Baas on Friday.

## 2015-10-06 NOTE — Telephone Encounter (Signed)
Pt's wife called again. She please call her on cell phone,she says she hope she hear from you today or by tomorrow.Marland Kitchen

## 2015-10-13 ENCOUNTER — Telehealth (HOSPITAL_COMMUNITY): Payer: Self-pay | Admitting: *Deleted

## 2015-10-13 NOTE — Telephone Encounter (Signed)
Patient given detailed instructions per Myocardial Perfusion Study Information Sheet for the test on 10/16/15 at 0815. Patient notified to arrive 15 minutes early and that it is imperative to arrive on time for appointment to keep from having the test rescheduled.  If you need to cancel or reschedule your appointment, please call the office within 24 hours of your appointment. Failure to do so may result in a cancellation of your appointment, and a $50 no show fee. Patient verbalized understanding.Kirandeep Fariss, Ranae [place]ms

## 2015-10-16 ENCOUNTER — Ambulatory Visit (HOSPITAL_BASED_OUTPATIENT_CLINIC_OR_DEPARTMENT_OTHER): Payer: Managed Care, Other (non HMO)

## 2015-10-16 ENCOUNTER — Ambulatory Visit (HOSPITAL_COMMUNITY): Payer: Managed Care, Other (non HMO) | Attending: Cardiology

## 2015-10-16 ENCOUNTER — Other Ambulatory Visit: Payer: Self-pay

## 2015-10-16 DIAGNOSIS — R0600 Dyspnea, unspecified: Secondary | ICD-10-CM | POA: Diagnosis present

## 2015-10-16 DIAGNOSIS — Z8249 Family history of ischemic heart disease and other diseases of the circulatory system: Secondary | ICD-10-CM | POA: Insufficient documentation

## 2015-10-16 DIAGNOSIS — I10 Essential (primary) hypertension: Secondary | ICD-10-CM | POA: Insufficient documentation

## 2015-10-16 DIAGNOSIS — E119 Type 2 diabetes mellitus without complications: Secondary | ICD-10-CM | POA: Diagnosis not present

## 2015-10-16 DIAGNOSIS — E785 Hyperlipidemia, unspecified: Secondary | ICD-10-CM | POA: Insufficient documentation

## 2015-10-16 DIAGNOSIS — E669 Obesity, unspecified: Secondary | ICD-10-CM | POA: Diagnosis not present

## 2015-10-16 DIAGNOSIS — I071 Rheumatic tricuspid insufficiency: Secondary | ICD-10-CM | POA: Diagnosis not present

## 2015-10-16 LAB — MYOCARDIAL PERFUSION IMAGING
CHL CUP RESTING HR STRESS: 81 {beats}/min
CSEPED: 8 min
CSEPEW: 10.1 METS
Exercise duration (sec): 0 s
LV dias vol: 106 mL
LVSYSVOL: 39 mL
MPHR: 155 {beats}/min
Peak HR: 144 {beats}/min
Percent HR: 92 %
RATE: 0.27
SDS: 0
SRS: 8
SSS: 4
TID: 0.9

## 2015-10-16 MED ORDER — TECHNETIUM TC 99M SESTAMIBI GENERIC - CARDIOLITE
32.4000 | Freq: Once | INTRAVENOUS | Status: AC | PRN
  Administered 2015-10-16: 32 via INTRAVENOUS

## 2015-10-16 MED ORDER — TECHNETIUM TC 99M SESTAMIBI GENERIC - CARDIOLITE
10.3000 | Freq: Once | INTRAVENOUS | Status: AC | PRN
  Administered 2015-10-16: 10 via INTRAVENOUS

## 2015-10-20 ENCOUNTER — Telehealth: Payer: Self-pay | Admitting: Interventional Cardiology

## 2015-10-20 NOTE — Telephone Encounter (Signed)
New Message:  Caryl Pina called in wanting to know if Dr. Tamala Julian could call her to discuss the pt's echo results. She is a PA for [employer] . Please f/u with her

## 2015-10-20 NOTE — Telephone Encounter (Signed)
Message fwd to Dr.Smith to call Ashley,PA @ [address]

## 2015-10-26 NOTE — Telephone Encounter (Signed)
Patient called wanting Doctor to call his daughter Lorenza Evangelist (who is a PA, [employer]) anytime to discuss his results  Cell phone :[phone]

## 2015-10-26 NOTE — Telephone Encounter (Signed)
Fwd to Dr.Smith

## 2015-10-27 ENCOUNTER — Ambulatory Visit: Payer: Managed Care, Other (non HMO) | Admitting: Interventional Cardiology

## 2015-10-27 NOTE — Telephone Encounter (Signed)
Follow up      Calling to get patient's echo results.  Please call work number til [phone] then call cell at [phone]

## 2015-10-27 NOTE — Telephone Encounter (Signed)
Discussed with daughter 

## 2018-01-05 DIAGNOSIS — E7800 Pure hypercholesterolemia, unspecified: Secondary | ICD-10-CM

## 2018-01-05 DIAGNOSIS — R1319 Other dysphagia: Secondary | ICD-10-CM | POA: Insufficient documentation

## 2018-01-05 HISTORY — DX: Other dysphagia: R13.19

## 2018-01-05 HISTORY — DX: Pure hypercholesterolemia, unspecified: E78.00

## 2018-10-05 ENCOUNTER — Encounter (HOSPITAL_COMMUNITY): Payer: Self-pay | Admitting: Emergency Medicine

## 2018-10-05 ENCOUNTER — Emergency Department (HOSPITAL_COMMUNITY): Payer: 59

## 2018-10-05 ENCOUNTER — Inpatient Hospital Stay (HOSPITAL_COMMUNITY)
Admission: EM | Admit: 2018-10-05 | Discharge: 2018-10-09 | DRG: 075 | Disposition: A | Discharge status: Home Health | Payer: 59 | Attending: Family Medicine | Admitting: Family Medicine

## 2018-10-05 DIAGNOSIS — E119 Type 2 diabetes mellitus without complications: Secondary | ICD-10-CM | POA: Diagnosis present

## 2018-10-05 DIAGNOSIS — Z6831 Body mass index (BMI) 31.0-31.9, adult: Secondary | ICD-10-CM

## 2018-10-05 DIAGNOSIS — R4701 Aphasia: Secondary | ICD-10-CM | POA: Diagnosis present

## 2018-10-05 DIAGNOSIS — R52 Pain, unspecified: Secondary | ICD-10-CM

## 2018-10-05 DIAGNOSIS — I10 Essential (primary) hypertension: Secondary | ICD-10-CM | POA: Diagnosis present

## 2018-10-05 DIAGNOSIS — I444 Left anterior fascicular block: Secondary | ICD-10-CM | POA: Diagnosis present

## 2018-10-05 DIAGNOSIS — G030 Nonpyogenic meningitis: Secondary | ICD-10-CM | POA: Diagnosis not present

## 2018-10-05 DIAGNOSIS — R410 Disorientation, unspecified: Secondary | ICD-10-CM | POA: Diagnosis not present

## 2018-10-05 DIAGNOSIS — J320 Chronic maxillary sinusitis: Secondary | ICD-10-CM | POA: Diagnosis present

## 2018-10-05 DIAGNOSIS — Z8042 Family history of malignant neoplasm of prostate: Secondary | ICD-10-CM | POA: Diagnosis not present

## 2018-10-05 DIAGNOSIS — E782 Mixed hyperlipidemia: Secondary | ICD-10-CM | POA: Diagnosis present

## 2018-10-05 DIAGNOSIS — G009 Bacterial meningitis, unspecified: Secondary | ICD-10-CM | POA: Diagnosis not present

## 2018-10-05 DIAGNOSIS — Z981 Arthrodesis status: Secondary | ICD-10-CM

## 2018-10-05 DIAGNOSIS — J0100 Acute maxillary sinusitis, unspecified: Secondary | ICD-10-CM | POA: Diagnosis present

## 2018-10-05 DIAGNOSIS — E1129 Type 2 diabetes mellitus with other diabetic kidney complication: Secondary | ICD-10-CM | POA: Diagnosis present

## 2018-10-05 DIAGNOSIS — Z79899 Other long term (current) drug therapy: Secondary | ICD-10-CM

## 2018-10-05 DIAGNOSIS — F40240 Claustrophobia: Secondary | ICD-10-CM

## 2018-10-05 DIAGNOSIS — H9190 Unspecified hearing loss, unspecified ear: Secondary | ICD-10-CM | POA: Diagnosis present

## 2018-10-05 DIAGNOSIS — Z8739 Personal history of other diseases of the musculoskeletal system and connective tissue: Secondary | ICD-10-CM | POA: Diagnosis not present

## 2018-10-05 DIAGNOSIS — Z8249 Family history of ischemic heart disease and other diseases of the circulatory system: Secondary | ICD-10-CM | POA: Diagnosis not present

## 2018-10-05 DIAGNOSIS — Z7984 Long term (current) use of oral hypoglycemic drugs: Secondary | ICD-10-CM | POA: Diagnosis not present

## 2018-10-05 DIAGNOSIS — R509 Fever, unspecified: Secondary | ICD-10-CM

## 2018-10-05 DIAGNOSIS — S90512A Abrasion, left ankle, initial encounter: Secondary | ICD-10-CM | POA: Diagnosis not present

## 2018-10-05 DIAGNOSIS — E559 Vitamin D deficiency, unspecified: Secondary | ICD-10-CM | POA: Diagnosis present

## 2018-10-05 DIAGNOSIS — J0110 Acute frontal sinusitis, unspecified: Secondary | ICD-10-CM | POA: Diagnosis not present

## 2018-10-05 DIAGNOSIS — E785 Hyperlipidemia, unspecified: Secondary | ICD-10-CM | POA: Diagnosis present

## 2018-10-05 DIAGNOSIS — H9202 Otalgia, left ear: Secondary | ICD-10-CM | POA: Diagnosis present

## 2018-10-05 DIAGNOSIS — G9341 Metabolic encephalopathy: Secondary | ICD-10-CM | POA: Diagnosis present

## 2018-10-05 DIAGNOSIS — Z803 Family history of malignant neoplasm of breast: Secondary | ICD-10-CM | POA: Diagnosis not present

## 2018-10-05 DIAGNOSIS — E669 Obesity, unspecified: Secondary | ICD-10-CM | POA: Diagnosis present

## 2018-10-05 DIAGNOSIS — R519 Headache, unspecified: Secondary | ICD-10-CM

## 2018-10-05 DIAGNOSIS — H9209 Otalgia, unspecified ear: Secondary | ICD-10-CM | POA: Diagnosis not present

## 2018-10-05 DIAGNOSIS — Z86010 Personal history of colonic polyps: Secondary | ICD-10-CM | POA: Diagnosis not present

## 2018-10-05 DIAGNOSIS — G039 Meningitis, unspecified: Secondary | ICD-10-CM | POA: Diagnosis not present

## 2018-10-05 DIAGNOSIS — R29705 NIHSS score 5: Secondary | ICD-10-CM | POA: Diagnosis present

## 2018-10-05 DIAGNOSIS — J329 Chronic sinusitis, unspecified: Secondary | ICD-10-CM | POA: Diagnosis present

## 2018-10-05 DIAGNOSIS — W228XXA Striking against or struck by other objects, initial encounter: Secondary | ICD-10-CM | POA: Diagnosis not present

## 2018-10-05 DIAGNOSIS — A879 Viral meningitis, unspecified: Principal | ICD-10-CM | POA: Diagnosis present

## 2018-10-05 DIAGNOSIS — R4182 Altered mental status, unspecified: Secondary | ICD-10-CM | POA: Diagnosis present

## 2018-10-05 DIAGNOSIS — I639 Cerebral infarction, unspecified: Secondary | ICD-10-CM | POA: Insufficient documentation

## 2018-10-05 DIAGNOSIS — R51 Headache: Secondary | ICD-10-CM

## 2018-10-05 HISTORY — DX: Type 2 diabetes mellitus without complications: E11.9

## 2018-10-05 LAB — URINALYSIS, ROUTINE W REFLEX MICROSCOPIC
Bilirubin Urine: NEGATIVE
GLUCOSE, UA: NEGATIVE mg/dL
HGB URINE DIPSTICK: NEGATIVE
KETONES UR: NEGATIVE mg/dL
Leukocytes, UA: NEGATIVE
Nitrite: NEGATIVE
PH: 5 (ref 5.0–8.0)
Protein, ur: 30 mg/dL — AB
Specific Gravity, Urine: 1.019 (ref 1.005–1.030)

## 2018-10-05 LAB — AMMONIA: Ammonia: 24 umol/L (ref 9–35)

## 2018-10-05 LAB — CBC WITH DIFFERENTIAL/PLATELET
Abs Immature Granulocytes: 0.04 10*3/uL (ref 0.00–0.07)
BASOS ABS: 0.1 10*3/uL (ref 0.0–0.1)
Basophils Relative: 1 %
Eosinophils Absolute: 0.7 10*3/uL — ABNORMAL HIGH (ref 0.0–0.5)
Eosinophils Relative: 8 %
HCT: 45.8 % (ref 39.0–52.0)
HEMOGLOBIN: 15.2 g/dL (ref 13.0–17.0)
Immature Granulocytes: 1 %
Lymphocytes Relative: 20 %
Lymphs Abs: 1.6 10*3/uL (ref 0.7–4.0)
MCH: 29.9 pg (ref 26.0–34.0)
MCHC: 33.2 g/dL (ref 30.0–36.0)
MCV: 90 fL (ref 80.0–100.0)
Monocytes Absolute: 0.8 10*3/uL (ref 0.1–1.0)
Monocytes Relative: 9 %
Neutro Abs: 5.1 10*3/uL (ref 1.7–7.7)
Neutrophils Relative %: 61 %
Platelets: 337 10*3/uL (ref 150–400)
RBC: 5.09 MIL/uL (ref 4.22–5.81)
RDW: 12.7 % (ref 11.5–15.5)
WBC: 8.3 10*3/uL (ref 4.0–10.5)
nRBC: 0 % (ref 0.0–0.2)

## 2018-10-05 LAB — LIPASE, BLOOD: Lipase: 35 U/L (ref 11–51)

## 2018-10-05 LAB — COMPREHENSIVE METABOLIC PANEL
ALK PHOS: 74 U/L (ref 38–126)
ALT: 23 U/L (ref 0–44)
AST: 21 U/L (ref 15–41)
Albumin: 3.8 g/dL (ref 3.5–5.0)
Anion gap: 8 (ref 5–15)
BUN: 14 mg/dL (ref 8–23)
CALCIUM: 9.4 mg/dL (ref 8.9–10.3)
CO2: 25 mmol/L (ref 22–32)
Chloride: 104 mmol/L (ref 98–111)
Creatinine, Ser: 1.02 mg/dL (ref 0.61–1.24)
GFR calc Af Amer: >60 mL/min (ref >60)
GFR calc non Af Amer: >60 mL/min (ref >60)
Glucose, Bld: 115 mg/dL — ABNORMAL HIGH (ref 70–99)
Potassium: 4.2 mmol/L (ref 3.5–5.1)
Sodium: 137 mmol/L (ref 135–145)
Total Bilirubin: 0.6 mg/dL (ref 0.3–1.2)
Total Protein: 7.1 g/dL (ref 6.5–8.1)

## 2018-10-05 LAB — LACTIC ACID, PLASMA
LACTIC ACID, VENOUS: 1.4 mmol/L (ref 0.5–1.9)
Lactic Acid, Venous: 1.1 mmol/L (ref 0.5–1.9)

## 2018-10-05 LAB — CBG MONITORING, ED: GLUCOSE-CAPILLARY: 102 mg/dL — AB (ref 70–99)

## 2018-10-05 MED ORDER — SODIUM CHLORIDE 0.9 % IV BOLUS
1000.0000 mL | Freq: Once | INTRAVENOUS | Status: AC
  Administered 2018-10-05: 1000 mL via INTRAVENOUS

## 2018-10-05 MED ORDER — SODIUM CHLORIDE 0.9 % IV SOLN
50.0000 mL/h | INTRAVENOUS | Status: DC
  Administered 2018-10-05: 50 mL/h via INTRAVENOUS

## 2018-10-05 MED ORDER — HYDROMORPHONE HCL 1 MG/ML IJ SOLN
INTRAMUSCULAR | Status: AC
  Administered 2018-10-05: 0.5 mg via INTRAVENOUS
  Filled 2018-10-05: qty 1

## 2018-10-05 MED ORDER — LABETALOL HCL 5 MG/ML IV SOLN: 20.0000 mg | Freq: Once | INTRAVENOUS | Status: DC | PRN

## 2018-10-05 MED ORDER — ACETAMINOPHEN 325 MG PO TABS
650.0000 mg | ORAL_TABLET | Freq: Once | ORAL | Status: AC
  Administered 2018-10-05: 650 mg via ORAL
  Filled 2018-10-05: qty 2

## 2018-10-05 MED ORDER — LORAZEPAM 2 MG/ML IJ SOLN
INTRAMUSCULAR | Status: AC
  Filled 2018-10-05: qty 1

## 2018-10-05 MED ORDER — ALTEPLASE (STROKE) FULL DOSE INFUSION
90.0000 mg | Freq: Once | INTRAVENOUS | Status: AC
  Administered 2018-10-05: 90 mg via INTRAVENOUS
  Filled 2018-10-05: qty 100

## 2018-10-05 MED ORDER — ACETAMINOPHEN 650 MG RE SUPP: 650.0000 mg | RECTAL | Status: DC | PRN

## 2018-10-05 MED ORDER — IOPAMIDOL (ISOVUE-370) INJECTION 76%
50.0000 mL | Freq: Once | INTRAVENOUS | Status: AC | PRN
  Administered 2018-10-05: 50 mL via INTRAVENOUS

## 2018-10-05 MED ORDER — ACETAMINOPHEN 160 MG/5ML PO SOLN: 650.0000 mg | ORAL | Status: DC | PRN

## 2018-10-05 MED ORDER — STROKE: EARLY STAGES OF RECOVERY BOOK: Freq: Once | Status: DC

## 2018-10-05 MED ORDER — HYDROMORPHONE HCL 1 MG/ML IJ SOLN
0.5000 mg | Freq: Once | INTRAMUSCULAR | Status: AC
  Administered 2018-10-05: 0.5 mg via INTRAVENOUS

## 2018-10-05 MED ORDER — PANTOPRAZOLE SODIUM 40 MG IV SOLR
40.0000 mg | Freq: Every day | INTRAVENOUS | Status: DC
  Administered 2018-10-05 – 2018-10-06 (×2): 40 mg via INTRAVENOUS
  Filled 2018-10-05 (×2): qty 40

## 2018-10-05 MED ORDER — CLEVIDIPINE BUTYRATE 0.5 MG/ML IV EMUL
0.0000 mg/h | INTRAVENOUS | Status: DC | PRN
  Administered 2018-10-06: 1 mg/h via INTRAVENOUS
  Filled 2018-10-05 (×2): qty 50

## 2018-10-05 MED ORDER — SODIUM CHLORIDE 0.9 % IV SOLN: 50.0000 mL | Freq: Once | INTRAVENOUS | Status: DC

## 2018-10-05 MED ORDER — QUETIAPINE FUMARATE 25 MG PO TABS
25.0000 mg | ORAL_TABLET | Freq: Once | ORAL | Status: AC
  Administered 2018-10-05: 25 mg via ORAL
  Filled 2018-10-05: qty 1

## 2018-10-05 MED ORDER — ACETAMINOPHEN 325 MG PO TABS
650.0000 mg | ORAL_TABLET | ORAL | Status: DC | PRN
  Administered 2018-10-06 – 2018-10-09 (×4): 650 mg via ORAL
  Filled 2018-10-05 (×6): qty 2

## 2018-10-05 MED ORDER — LORAZEPAM 2 MG/ML IJ SOLN
2.0000 mg | INTRAMUSCULAR | Status: AC
  Administered 2018-10-05: 2 mg via INTRAVENOUS

## 2018-10-05 NOTE — H&P (Addendum)
NEURO HOSPITALIST  CONSULT   Chief Complaint: aphasia  History obtained from:  Family/ Chart  HPI:                                                                                                                                         [name] is an 68 y.o. male with PMH HTN, HLD, DM who presented to [facility] ED for c/o confusion and balance issues. Code stroke initiated in ED.   Per report: Patient has had a sinus infection for for over a week. He finished augmentin and has received two steroid shots as well. Patient was at his PCP where he developed confusion and slowness that had improved by the time he arrived to ED, but he never returned back to baseline. While he was in ED he became worse again and code stroke was initiated. Patient also complained of shooting left ear pain. Denies prior stroke history. Denies slurred speech, SOB, visual changes.   ED course:  CTA: no LVO CTH: no hemorrhage BP: 148/98 BG: 115   Date last known well: 10/05/2018  Time last known well: 1200, first known abnormal at 12:15 tPA Given: Yes @1615  Modified Rankin: Rankin Score=0 NIHSS:5    Past Medical History:  Diagnosis Date  . Decreased hearing   . Diabetes mellitus without complication ([place])   . History of adenomatous polyp of colon   . Hyperlipidemia   . Hypertension   . Lumbar spondylosis   . Obesity   . Prediabetes   . Vitamin D deficiency     Past Surgical History:  Procedure Laterality Date  . BACK SURGERY    . COLONOSCOPY  07/21/2004, 04/03/2010  . EYE MUSCLE REPAIR    . [place]  . LUMBAR FUSION  04/17/2009   L4-L5  . LUMBAR FUSION  1992   L4-L5    Family History  Problem Relation Age of Onset  . Heart failure Mother   . Hypertension Mother   . Hypertension Father   . Breast cancer Sister   . Prostate cancer Brother   . Heart attack Brother 6  . Heart failure Maternal Grandmother   . Heart failure Maternal  Grandfather   . Heart failure Paternal Grandmother   . Heart failure Paternal Grandfather          Social History:  reports that he has never smoked. He does not have any smokeless tobacco history on file. He reports current alcohol use. He reports that he does not use drugs.  Allergies: No Known Allergies  Medications:  Current Facility-Administered Medications  Medication Dose Route Frequency Provider Last Rate Last Dose  .  stroke: mapping our early stages of recovery book   Does not apply Once Greta Doom, MD      . alteplase (ACTIVASE) 1 mg/mL infusion 90 mg  90 mg Intravenous Once Karren Cobble, RPH 90 mL/hr at 10/05/18 1615 90 mg at 10/05/18 1615   Followed by  . 0.9 %  sodium chloride infusion  50 mL Intravenous Once Karren Cobble, RPH      . 0.9 %  sodium chloride infusion  50 mL/hr Intravenous Continuous Greta Doom, MD      . acetaminophen (TYLENOL) tablet 650 mg  650 mg Oral Q4H PRN Greta Doom, MD       Or  . acetaminophen (TYLENOL) solution 650 mg  650 mg Per Tube Q4H PRN Greta Doom, MD       Or  . acetaminophen (TYLENOL) suppository 650 mg  650 mg Rectal Q4H PRN Greta Doom, MD      . labetalol (NORMODYNE,TRANDATE) injection 20 mg  20 mg Intravenous Once PRN Greta Doom, MD       And  . clevidipine (CLEVIPREX) infusion 0.5 mg/mL  0-21 mg/hr Intravenous Continuous PRN Greta Doom, MD      . LORazepam (ATIVAN) 2 MG/ML injection           . LORazepam (ATIVAN) injection 2 mg  2 mg Intravenous STAT Laurey Morale N, NP      . pantoprazole (PROTONIX) injection 40 mg  40 mg Intravenous QHS Greta Doom, MD       Current Outpatient Medications  Medication Sig Dispense Refill  . acetaminophen (TYLENOL) 500 MG tablet Take 1,000 mg by mouth 2 (two) times daily  as needed for mild pain.    Marland Kitchen azelastine (ASTELIN) 0.1 % nasal spray Place 1 spray into both nostrils 2 (two) times daily. Use in each nostril as directed    . cholecalciferol (VITAMIN D) 1000 UNITS tablet Take 1,000 Units by mouth daily.      . diazepam (VALIUM) 5 MG tablet Take 5 mg by mouth at bedtime as needed for sleep.    Marland Kitchen doxycycline (VIBRAMYCIN) 100 MG capsule Take 100 mg by mouth 2 (two) times daily. For 10 days    . FLUoxetine (PROZAC) 10 MG capsule Take 1 capsule by mouth daily.    . fluticasone (FLONASE) 50 MCG/ACT nasal spray Place 1 spray into both nostrils daily.    Marland Kitchen HYDROcodone-homatropine (HYCODAN) 5-1.5 MG/5ML syrup Take 5 mLs by mouth every 6 (six) hours as needed for cough.    Marland Kitchen ibuprofen (ADVIL,MOTRIN) 800 MG tablet Take 800 mg by mouth 3 (three) times daily as needed for moderate pain.    . metFORMIN (GLUCOPHAGE) 500 MG tablet Take 1 tablet by mouth daily.     Marland Kitchen omeprazole (PRILOSEC) 20 MG capsule Take 20 mg by mouth daily.    . rosuvastatin (CRESTOR) 10 MG tablet Take 10 mg by mouth daily.    . S-Adenosylmethionine (SAM-E) 400 MG TABS Take 1 tablet by mouth daily.         ROS:  unobtainable from patient due to mental status     General Examination:                                                                                                      Blood pressure (!) 154/98, pulse 87, temperature 98.3 F (36.8 C), temperature source Oral, resp. rate (!) 22, weight 106.2 kg, SpO2 97 %.  HEENT-  Normocephalic, no lesions, without obvious abnormality.  Normal external eye and conjunctiva. Cardiovascular- S1-S2 audible, pulses palpable throughout  Lungs-no rhonchi or wheezing noted, no excessive working breathing.  Saturations within normal limits on RA Abdomen- All 4 quadrants palpated and non tender Extremities- Warm, dry and  intact Musculoskeletal-no joint tenderness, deformity or swelling Skin-warm and dry, no hyperpigmentation, vitiligo, or suspicious lesions  Neurological Examination Mental Status: Alert, aphasic, not following commands. Agitated. Cranial Nerves: II: blinks to threat bilaterally   III,IV, VI: ptosis not present, extra-ocular motions intact bilaterally, pupils equal, round, reactive to light and accommodation V,VII: smile symmetric, facial light touch sensation normal bilaterally VIII: bilateral hearing aids  IX,X: uvula rises midline XI: bilateral shoulder shrug XII: midline tongue extension Motor/ Sesnory: Able to move all 4 extremities anti gravity. Move all 4 to light touch.  Tone and bulk:normal tone throughout; no atrophy noted  Plantars: Right: downgoing   Left: downgoing Cerebellar: UTA Gait: deferred   Lab Results: Basic Metabolic Panel: Recent Labs  Lab 10/05/18 1447  NA 137  K 4.2  CL 104  CO2 25  GLUCOSE 115*  BUN 14  CREATININE 1.02  CALCIUM 9.4    CBC: Recent Labs  Lab 10/05/18 1447  WBC 8.3  NEUTROABS 5.1  HGB 15.2  HCT 45.8  MCV 90.0  PLT 337    Lipid Panel: No results for input(s): CHOL, TRIG, HDL, CHOLHDL, VLDL, LDLCALC in the last 168 hours.  CBG: Recent Labs  Lab 10/05/18 1436  GLUCAP 102*    Imaging: Dg Chest 2 View  Result Date: 10/05/2018 CLINICAL DATA:  Acute onset altered mental status today. EXAM: CHEST - 2 VIEW COMPARISON:  None. FINDINGS: The lungs are clear. Elevation of the right hemidiaphragm relative to the left noted. Heart size is normal. No pneumothorax or pleural fluid. No acute or focal bony abnormality. IMPRESSION: No acute disease. Electronically Signed   By: Inge Rise M.D.   On: 10/05/2018 16:12   Ct Head Wo Contrast  Result Date: 10/05/2018 CLINICAL DATA:  Headache and left-sided facial pain for 1 week. EXAM: CT HEAD WITHOUT CONTRAST CT MAXILLOFACIAL WITHOUT CONTRAST TECHNIQUE: Multidetector CT imaging  of the head and maxillofacial structures were performed using the standard protocol without intravenous contrast. Multiplanar CT image reconstructions of the maxillofacial structures were also generated. COMPARISON:  None. FINDINGS: CT HEAD FINDINGS Brain: Mild diffuse cortical atrophy is noted. Mild chronic ischemic white matter disease is noted. No mass effect or midline shift is noted. Ventricular size is within normal limits. There is no evidence of mass lesion, hemorrhage or acute infarction. Vascular: No hyperdense vessel or unexpected calcification. Skull: Normal. Negative for fracture or focal  lesion. Other: None. CT MAXILLOFACIAL FINDINGS Osseous: No fracture or mandibular dislocation. No destructive process. Orbits: Negative. No traumatic or inflammatory finding. Sinuses: Bilateral maxillary sinusitis is noted with air-fluid levels. Soft tissues: Negative. IMPRESSION: Mild diffuse cortical atrophy. Mild chronic ischemic white matter disease. No acute intracranial abnormality seen. Bilateral maxillary sinusitis is noted. Electronically Signed   By: Marijo Conception, M.D.   On: 10/05/2018 15:50   Ct Head Code Stroke Wo Contrast  Result Date: 10/05/2018 CLINICAL DATA:  Code stroke. Imbalance and confusion. Headache and facial pain. EXAM: CT HEAD WITHOUT CONTRAST TECHNIQUE: Contiguous axial images were obtained from the base of the skull through the vertex without intravenous contrast. COMPARISON:  Head CT 10/05/2018 at 1520 hours FINDINGS: Brain: No definite acute infarct, intracranial hemorrhage, mass, midline shift, or extra-axial fluid collection is identified within limitations of mild to slightly moderate motion artifact. Slight asymmetric hypoattenuation in the central portion of the right cerebellar hemispheres favored to be artifactual. Mild cerebral atrophy is again noted. Cerebral white matter hypodensities are nonspecific but compatible with mild chronic small vessel ischemic disease. Vascular:  Mild calcified atherosclerosis at the skull base. No hyperdense vessel. Skull: No fracture or focal osseous lesion. Sinuses/Orbits: Prominent bilateral maxillary sinus fluid levels, unchanged. Mild bilateral ethmoid air cell mucosal thickening. Clear mastoid air cells. Unremarkable orbits. Other: None. ASPECTS [facility] Stroke Program Early CT Score) Not scored with this history. IMPRESSION: 1. No evidence of acute intracranial abnormality within limitations of motion artifact. 2. Mild chronic small vessel ischemic disease and cerebral atrophy. These results were communicated to Dr. Leonel Ramsay at 4:24 pm on 10/05/2018 by text page via the [facility] messaging system. Electronically Signed   By: Logan Bores M.D.   On: 10/05/2018 16:24   Ct Maxillofacial Wo Contrast  Result Date: 10/05/2018 CLINICAL DATA:  Headache and left-sided facial pain for 1 week. EXAM: CT HEAD WITHOUT CONTRAST CT MAXILLOFACIAL WITHOUT CONTRAST TECHNIQUE: Multidetector CT imaging of the head and maxillofacial structures were performed using the standard protocol without intravenous contrast. Multiplanar CT image reconstructions of the maxillofacial structures were also generated. COMPARISON:  None. FINDINGS: CT HEAD FINDINGS Brain: Mild diffuse cortical atrophy is noted. Mild chronic ischemic white matter disease is noted. No mass effect or midline shift is noted. Ventricular size is within normal limits. There is no evidence of mass lesion, hemorrhage or acute infarction. Vascular: No hyperdense vessel or unexpected calcification. Skull: Normal. Negative for fracture or focal lesion. Other: None. CT MAXILLOFACIAL FINDINGS Osseous: No fracture or mandibular dislocation. No destructive process. Orbits: Negative. No traumatic or inflammatory finding. Sinuses: Bilateral maxillary sinusitis is noted with air-fluid levels. Soft tissues: Negative. IMPRESSION: Mild diffuse cortical atrophy. Mild chronic ischemic white matter disease. No acute  intracranial abnormality seen. Bilateral maxillary sinusitis is noted. Electronically Signed   By: Marijo Conception, M.D.   On: 10/05/2018 15:50     Laurey Morale, MSN, NP-C [facility] [phone]   10/05/2018, 4:50 PM   Attending physician note to follow with Assessment and plan .  I have seen the patient and reviewed the above note.  On my exam, the patient is aphasic, and a question of right hemianopia, but this is not clear.  He does not really blink to threat from either direction, but does fixate and track.  He apparently has some vision problems in the right eye at baseline as well, further complicating this assessment.  His aphasia is predominantly receptive, he does unable to follow commands, and the words  that come out are not completely gibberish, but not completely appropriate to the situation either.   Assessment: [name] is an 68 y.o. male with PMH HTN, HLD, DM who presented to [facility] ED for aphasia.  He initially cleared to a point where TPA was not a consideration until a few minutes prior to my walking in the room.  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  He has no signs of CNS infection, and the sinusitis we see on CT is in the maxillary sinuses, without evidence of spread.  Also, CNS infection would not have expected to transiently clear.   While he was able to speak clearly, he described that he had memory of who was coming in at home, but just could not say the names.  There is no evidence of large vessel occlusion on CTA, but he will be admitted to the ICU for close monitoring post TPA.  Given his complaints of ear pain for a while before this started, I continue to be concerned about dissection and there is a region in the neck where we do not image well with the CTA.  I would favor getting a MRA neck with and without contrast.  Plan:  -- BP goal post tPA is 180/138mmHg, --MRI Brain, MR angiogram of the  neck, to rule out dissection. --Echocardiogram -- Prophylactic therapy-Antiplatelet med -- High intensity Statin if LDL > 70 -- HgbA1c, fasting lipid panel -- PT consult, OT consult, Speech consult --Telemetry monitoring --Frequent neuro checks --Stroke swallow screen   CNS  -Close neuro monitoring  RESP No acute issues monitor  CV Essential (primary) hypertension -Aggressive BP control, goal SBP < 180 -Titrate oral agents  GI/GU -Gentle hydration  ENDO Type 2 diabetes mellitus w/o complications . -SSI -goal HgbA1c < 7   ID ?  Sinusitis, holding medications for now. -CXR -NPO -Monitor  Prophylaxis DVT: SCD's only  GI: doc/ senna    Dispo:TBD  Diet: NPO until cleared by speech or bedside swallow eval  Code Status: Full Code    --please page stroke NP  Or  PA  Or MD from 8am -4 pm  as this patient from this time will be  followed by the stroke.   You can look them up on [email or web address]  Password [email or web address]   This patient is critically ill and at significant risk of neurological worsening, death and care requires constant monitoring of vital signs, hemodynamics,respiratory and cardiac monitoring, neurological assessment, discussion with family, other specialists and medical decision making of high complexity. I spent 60 minutes of neurocritical care time  in the care of  this patient. This was time spent independent of any time provided by nurse practitioner or PA.  Roland Rack, MD [facility] [phone]  If 7pm- 7am, please page neurology on call as listed in [place]. 10/05/2018  5:30 PM

## 2018-10-05 NOTE — Code Documentation (Signed)
68yo male arriving to [facility] via [place] at 19. Family reports patient diagnosed with an ear infection on antibiotics with severe ear pain x 2 days. Patient was at his baseline at 1215 and then was noted to have confusion at 1230 today. Family called EMS who transported him to the ED. Patient reportedly had improvement followed by worsening. Code stroke activated. Stroke team to CT. CT completed followed by CTA, however, patient agitated requiring Ativan IVP to complete CTA. Patient with severe aphasia on exam. Order to treat with IV tPA. 9mg  tPA bolus given over 1 minute followed by 81mg /hr for a total of 90mg  per pharmacy dosing. Patient with some improvement in agitation. Patient with continued aphasia. Patient transferred to 4N with Stroke RN and ED RN. Bedside handoff with Merrily Pew, RN.

## 2018-10-05 NOTE — ED Provider Notes (Addendum)
[facility] Provider Note   CSN: [id number] Arrival date & time: 10/05/18  1419     History   Chief Complaint Chief Complaint  Patient presents with  . Altered Mental Status    HPI [name] is a 68 y.o. male.  The history is provided by the patient, a caregiver and a relative.  Altered Mental Status  Presenting symptoms: confusion   Presenting symptoms: no behavior changes, no combativeness, no disorientation, no lethargy, no partial responsiveness and no unresponsiveness   Presenting symptoms comment:  General slowness Severity:  Mild Most recent episode:  Today Episode history:  Single Progression:  Improving Chronicity:  New Context: recent infection (Patient current on doxycline for ear/sinus infection, patient finished course of augmentin. Given multiple steroid shots as well. Was a PCP and developed some confusion, slowness. Improving per family. No focal neurologic findings. Had codeine last night.)   Associated symptoms: fever   Associated symptoms: no abdominal pain, normal movement, no agitation, no bladder incontinence, no decreased appetite, no depression, no difficulty breathing, no eye deviation, no hallucinations, no headaches, no light-headedness, no nausea, no palpitations, no rash, no seizures, no slurred speech, no suicidal behavior, no visual change, no vomiting and no weakness     Past Medical History:  Diagnosis Date  . Decreased hearing   . Diabetes mellitus without complication ([place])   . History of adenomatous polyp of colon   . Hyperlipidemia   . Hypertension   . Lumbar spondylosis   . Obesity   . Prediabetes   . Vitamin D deficiency     Patient Active Problem List   Diagnosis Date Noted  . Abnormal ECG 12/24/2014  . DM (diabetes mellitus), type 2 with renal complications ([place]) 29/52/8413  . Hyperlipidemia 12/24/2014  . Jaw pain 08/08/2011  . Cardiovascular risk factor 08/08/2011    Past  Surgical History:  Procedure Laterality Date  . BACK SURGERY    . COLONOSCOPY  07/21/2004, 04/03/2010  . EYE MUSCLE REPAIR    . [place]  . LUMBAR FUSION  04/17/2009   L4-L5  . LUMBAR FUSION  1992   L4-L5        Home Medications    Prior to Admission medications   Medication Sig Start Date End Date Taking? Authorizing Provider  acetaminophen (TYLENOL) 500 MG tablet Take 1,000 mg by mouth 2 (two) times daily as needed for mild pain.   Yes [provider]  azelastine (ASTELIN) 0.1 % nasal spray Place 1 spray into both nostrils 2 (two) times daily. Use in each nostril as directed   Yes [provider]  cholecalciferol (VITAMIN D) 1000 UNITS tablet Take 1,000 Units by mouth daily.     Yes [provider]  diazepam (VALIUM) 5 MG tablet Take 5 mg by mouth at bedtime as needed for sleep. 02/20/17  Yes [provider]  doxycycline (VIBRAMYCIN) 100 MG capsule Take 100 mg by mouth 2 (two) times daily. For 10 days 10/04/18  Yes [provider]  FLUoxetine (PROZAC) 10 MG capsule Take 1 capsule by mouth daily. 09/23/15  Yes [provider]  fluticasone (FLONASE) 50 MCG/ACT nasal spray Place 1 spray into both nostrils daily.   Yes [provider]  HYDROcodone-homatropine (HYCODAN) 5-1.5 MG/5ML syrup Take 5 mLs by mouth every 6 (six) hours as needed for cough.   Yes [provider]  ibuprofen (ADVIL,MOTRIN) 800 MG tablet Take 800 mg by mouth 3 (three) times daily  as needed for moderate pain.   Yes [provider]  metFORMIN (GLUCOPHAGE) 500 MG tablet Take 1 tablet by mouth daily.  09/14/15  Yes [provider]  omeprazole (PRILOSEC) 20 MG capsule Take 20 mg by mouth daily.   Yes [provider]  rosuvastatin (CRESTOR) 10 MG tablet Take 10 mg by mouth daily.   Yes [provider]  S-Adenosylmethionine (SAM-E) 400 MG TABS Take 1 tablet by mouth daily.     Yes [provider]     Family History Family History  Problem Relation Age of Onset  . Heart failure Mother   . Hypertension Mother   . Hypertension Father   . Breast cancer Sister   . Prostate cancer Brother   . Heart attack Brother 62  . Heart failure Maternal Grandmother   . Heart failure Maternal Grandfather   . Heart failure Paternal Grandmother   . Heart failure Paternal Grandfather     Social History Social History   Tobacco Use  . Smoking status: Never Smoker  Substance Use Topics  . Alcohol use: Yes    Comment: 2 to 3 x per week.  . Drug use: No     Allergies   Patient has no known allergies.   Review of Systems Review of Systems  Constitutional: Positive for fever. Negative for chills and decreased appetite.  HENT: Positive for congestion and ear pain (left sided ear and facial pain intermittently ). Negative for facial swelling, hearing loss, mouth sores, nosebleeds, rhinorrhea, sinus pressure, sinus pain, sore throat, tinnitus and trouble swallowing.   Eyes: Negative for pain and visual disturbance.  Respiratory: Negative for cough and shortness of breath.   Cardiovascular: Negative for chest pain and palpitations.  Gastrointestinal: Negative for abdominal pain, nausea and vomiting.  Genitourinary: Negative for bladder incontinence, dysuria and hematuria.  Musculoskeletal: Negative for arthralgias, back pain, gait problem, joint swelling, myalgias, neck pain and neck stiffness.  Skin: Negative for color change and rash.  Neurological: Negative for dizziness, tremors, seizures, syncope, facial asymmetry, speech difficulty, weakness, light-headedness, numbness and headaches.  Psychiatric/Behavioral: Positive for confusion. Negative for agitation and hallucinations.  All other systems reviewed and are negative.    Physical Exam Updated Vital Signs  ED Triage Vitals  Enc Vitals Group     BP 10/05/18 1431 (!) 153/110     Pulse Rate 10/05/18 1431 89     Resp 10/05/18 1431  (!) 21     Temp 10/05/18 1432 98.3 F (36.8 C)     Temp Source 10/05/18 1432 Oral     SpO2 10/05/18 1431 99 %     Weight --      Height --      Head Circumference --      Peak Flow --      Pain Score 10/05/18 1433 0     Pain Loc --      Pain Edu? --      Excl. in [place]? --     Physical Exam Vitals signs and nursing note reviewed.  Constitutional:      General: He is not in acute distress.    Appearance: He is well-developed. He is not ill-appearing.  HENT:     Head: Normocephalic and atraumatic.     Right Ear: Tympanic membrane and ear canal normal.     Left Ear: Tympanic membrane and ear canal normal.     Ears:     Comments: No mastoid tenderness or fullness  Nose: Nose normal.     Mouth/Throat:     Mouth: Mucous membranes are moist.  Eyes:     Extraocular Movements: Extraocular movements intact.     Conjunctiva/sclera: Conjunctivae normal.     Pupils: Pupils are equal, round, and reactive to light.  Neck:     Musculoskeletal: Normal range of motion and neck supple. No neck rigidity or muscular tenderness.  Cardiovascular:     Rate and Rhythm: Normal rate and regular rhythm.     Pulses: Normal pulses.     Heart sounds: Normal heart sounds. No murmur.  Pulmonary:     Effort: Pulmonary effort is normal. No respiratory distress.     Breath sounds: Normal breath sounds.  Abdominal:     Palpations: Abdomen is soft.     Tenderness: There is no abdominal tenderness.  Musculoskeletal:     Right lower leg: No edema.     Left lower leg: No edema.  Skin:    General: Skin is warm and dry.  Neurological:     General: No focal deficit present.     Mental Status: He is alert and oriented to person, place, and time.     Cranial Nerves: No cranial nerve deficit.     Sensory: No sensory deficit.     Motor: No weakness.     Coordination: Coordination normal.     Gait: Gait normal.     Comments: Patient is slow to respond but is alert and oriented, 5+ out of 5 strength  throughout, normal sensation throughout, normal finger-to-nose finger, no drift, mildly confused  Psychiatric:        Mood and Affect: Mood normal.      ED Treatments / Results  Labs (all labs ordered are listed, but only abnormal results are displayed) Labs Reviewed  CBC WITH DIFFERENTIAL/PLATELET - Abnormal; Notable for the following components:      Result Value   Eosinophils Absolute 0.7 (*)    All other components within normal limits  COMPREHENSIVE METABOLIC PANEL - Abnormal; Notable for the following components:   Glucose, Bld 115 (*)    All other components within normal limits  URINALYSIS, ROUTINE W REFLEX MICROSCOPIC - Abnormal; Notable for the following components:   Protein, ur 30 (*)    Bacteria, UA RARE (*)    All other components within normal limits  CBG MONITORING, ED - Abnormal; Notable for the following components:   Glucose-Capillary 102 (*)    All other components within normal limits  URINE CULTURE  CULTURE, BLOOD (ROUTINE X 2)  CULTURE, BLOOD (ROUTINE X 2)  LIPASE, BLOOD  AMMONIA  LACTIC ACID, PLASMA  LACTIC ACID, PLASMA  I-STAT TROPONIN, ED    EKG EKG Interpretation  Date/Time:  Friday October 05 2018 15:09:40 EST Ventricular Rate:  85 PR Interval:    QRS Duration: 93 QT Interval:  366 QTC Calculation: 436 R Axis:   -49 Text Interpretation:  Sinus rhythm Left anterior fascicular block Abnormal R-wave progression, early transition Confirmed by Malvin Johns [phone]) on 10/05/2018 4:08:24 PM   Radiology Dg Chest 2 View  Result Date: 10/05/2018 CLINICAL DATA:  Acute onset altered mental status today. EXAM: CHEST - 2 VIEW COMPARISON:  None. FINDINGS: The lungs are clear. Elevation of the right hemidiaphragm relative to the left noted. Heart size is normal. No pneumothorax or pleural fluid. No acute or focal bony abnormality. IMPRESSION: No acute disease. Electronically Signed   By: Inge Rise M.D.   On: 10/05/2018 16:12  Ct Head Wo  Contrast  Result Date: 10/05/2018 CLINICAL DATA:  Headache and left-sided facial pain for 1 week. EXAM: CT HEAD WITHOUT CONTRAST CT MAXILLOFACIAL WITHOUT CONTRAST TECHNIQUE: Multidetector CT imaging of the head and maxillofacial structures were performed using the standard protocol without intravenous contrast. Multiplanar CT image reconstructions of the maxillofacial structures were also generated. COMPARISON:  None. FINDINGS: CT HEAD FINDINGS Brain: Mild diffuse cortical atrophy is noted. Mild chronic ischemic white matter disease is noted. No mass effect or midline shift is noted. Ventricular size is within normal limits. There is no evidence of mass lesion, hemorrhage or acute infarction. Vascular: No hyperdense vessel or unexpected calcification. Skull: Normal. Negative for fracture or focal lesion. Other: None. CT MAXILLOFACIAL FINDINGS Osseous: No fracture or mandibular dislocation. No destructive process. Orbits: Negative. No traumatic or inflammatory finding. Sinuses: Bilateral maxillary sinusitis is noted with air-fluid levels. Soft tissues: Negative. IMPRESSION: Mild diffuse cortical atrophy. Mild chronic ischemic white matter disease. No acute intracranial abnormality seen. Bilateral maxillary sinusitis is noted. Electronically Signed   By: Marijo Conception, M.D.   On: 10/05/2018 15:50   Ct Head Code Stroke Wo Contrast  Result Date: 10/05/2018 CLINICAL DATA:  Code stroke. Imbalance and confusion. Headache and facial pain. EXAM: CT HEAD WITHOUT CONTRAST TECHNIQUE: Contiguous axial images were obtained from the base of the skull through the vertex without intravenous contrast. COMPARISON:  Head CT 10/05/2018 at 1520 hours FINDINGS: Brain: No definite acute infarct, intracranial hemorrhage, mass, midline shift, or extra-axial fluid collection is identified within limitations of mild to slightly moderate motion artifact. Slight asymmetric hypoattenuation in the central portion of the right cerebellar  hemispheres favored to be artifactual. Mild cerebral atrophy is again noted. Cerebral white matter hypodensities are nonspecific but compatible with mild chronic small vessel ischemic disease. Vascular: Mild calcified atherosclerosis at the skull base. No hyperdense vessel. Skull: No fracture or focal osseous lesion. Sinuses/Orbits: Prominent bilateral maxillary sinus fluid levels, unchanged. Mild bilateral ethmoid air cell mucosal thickening. Clear mastoid air cells. Unremarkable orbits. Other: None. ASPECTS [facility] Stroke Program Early CT Score) Not scored with this history. IMPRESSION: 1. No evidence of acute intracranial abnormality within limitations of motion artifact. 2. Mild chronic small vessel ischemic disease and cerebral atrophy. These results were communicated to Dr. Leonel Ramsay at 4:24 pm on 10/05/2018 by text page via the [facility] messaging system. Electronically Signed   By: Logan Bores M.D.   On: 10/05/2018 16:24   Ct Maxillofacial Wo Contrast  Result Date: 10/05/2018 CLINICAL DATA:  Headache and left-sided facial pain for 1 week. EXAM: CT HEAD WITHOUT CONTRAST CT MAXILLOFACIAL WITHOUT CONTRAST TECHNIQUE: Multidetector CT imaging of the head and maxillofacial structures were performed using the standard protocol without intravenous contrast. Multiplanar CT image reconstructions of the maxillofacial structures were also generated. COMPARISON:  None. FINDINGS: CT HEAD FINDINGS Brain: Mild diffuse cortical atrophy is noted. Mild chronic ischemic white matter disease is noted. No mass effect or midline shift is noted. Ventricular size is within normal limits. There is no evidence of mass lesion, hemorrhage or acute infarction. Vascular: No hyperdense vessel or unexpected calcification. Skull: Normal. Negative for fracture or focal lesion. Other: None. CT MAXILLOFACIAL FINDINGS Osseous: No fracture or mandibular dislocation. No destructive process. Orbits: Negative. No traumatic or inflammatory  finding. Sinuses: Bilateral maxillary sinusitis is noted with air-fluid levels. Soft tissues: Negative. IMPRESSION: Mild diffuse cortical atrophy. Mild chronic ischemic white matter disease. No acute intracranial abnormality seen. Bilateral maxillary sinusitis is noted. Electronically Signed  By: Marijo Conception, M.D.   On: 10/05/2018 15:50    Procedures .Critical Care Performed by: Lennice Sites, DO Authorized by: Lennice Sites, DO   Critical care provider statement:    Critical care time (minutes):  50   Critical care time was exclusive of:  Teaching time and separately billable procedures and treating other patients   Critical care was necessary to treat or prevent imminent or life-threatening deterioration of the following conditions:  CNS failure or compromise   Critical care was time spent personally by me on the following activities:  Development of treatment plan with patient or surrogate, discussions with consultants, evaluation of patient's response to treatment, examination of patient, obtaining history from patient or surrogate, ordering and performing treatments and interventions, ordering and review of laboratory studies, ordering and review of radiographic studies, pulse oximetry, re-evaluation of patient's condition and review of old charts   I assumed direction of critical care for this patient from another provider in my specialty: no     (including critical care time)  Medications Ordered in ED Medications  LORazepam (ATIVAN) 2 MG/ML injection (has no administration in time range)  sodium chloride 0.9 % bolus 1,000 mL (0 mLs Intravenous Stopped 10/05/18 1609)  acetaminophen (TYLENOL) tablet 650 mg (650 mg Oral Given 10/05/18 1504)     Initial Impression / Assessment and Plan / ED Course  I have reviewed the triage vital signs and the nursing notes.  Pertinent labs & imaging results that were available during my care of the patient were reviewed by me and considered in  my medical decision making (see chart for details).     ARES CARDOZO is a 68 year old male with history of hypertension, high cholesterol, prediabetes who presents to the ED with altered mental status.  Patient with normal vitals.  No fever.  Patient has been undergoing treatment for sinus infection.  Finished a course of Augmentin but now has been started on doxycycline as he continues to have left ear pain left-sided facial pain.  Patient has been given 2 shots of steroids as well.  Patient was at primary care doctor and got confused.  Family states that it is improving.  Did take some codeine last night.  No other new medications otherwise.  Patient appears slow but there is no focal neurological finding on exam.  According to family members patient had confusion and appears to have amnesia to events that occurred earlier today but is getting better.  They are concerned about possible ongoing infectious process however patient is without fever.  Has had unremarkable chest x-ray and strep testing and influenza testing yesterday.  Will initiate infectious work-up, altered mental status work-up with ammonia, CTA, CT face.  Low concern for mastoiditis.  There is no mastoid tenderness, there is no signs of ear or throat infection on exam.  Patient appears to be having pain mostly in the V2 distribution of the left face, possible trigeminal neuralgia.  Possible episode of global transient amnesia as well.  Lab work shows no signs of leukocytosis, no lactic acidosis.  No significant electrolyte abnormalities, kidney injury.  Patient with no signs of urinary tract infection.  Gallbladder and liver enzymes within normal limits.  Ammonia within normal limits.  EKG shows sinus rhythm with no signs of ischemic changes.  CT scan of the head show no acute findings.  He does have some maxillary sinusitis bilaterally as well but overall unremarkable imaging.  Given concern for possible transient global amnesia, TIA,  trigeminal neuralgia neurology was consulted. They will come evaluate the patient for further recommendations.  Family updated about the plan.  Patient appears stable.  4pm, Patient became densely aphasic and code stroke was initiated, neurology at bedside.  Patient went back for CT perfusion study.  Patient to be given TPA, as already with negative head CT for blood. Patient to be admitted to the neurological ICU. BP normal, no other obvious symptoms by aphasia.  This chart was dictated using voice recognition software.  Despite best efforts to proofread,  errors can occur which can change the documentation meaning.    Final Clinical Impressions(s) / ED Diagnoses   Final diagnoses:  Altered mental status, unspecified altered mental status type  Left facial pain  Left ear pain  Cerebrovascular accident (CVA), unspecified mechanism [facility])    ED Discharge Orders    None       Lennice Sites, DO 10/05/18 [place], [place], DO 10/05/18 1921

## 2018-10-05 NOTE — ED Notes (Signed)
Pt back from CT head. New onset confusion and speech difficulty. MD [place] at bedside to evaluate pt. CODE STROKE CALLED.

## 2018-10-05 NOTE — ED Notes (Signed)
Patient transported to CT 

## 2018-10-05 NOTE — ED Notes (Signed)
Pt very aggressive and confused in CT. Pt is currently refusing the CT angio. Will medicate to help relax and bring family in to talk to pt.

## 2018-10-05 NOTE — ED Notes (Signed)
Pt transferred with this RN to 4N

## 2018-10-05 NOTE — ED Triage Notes (Signed)
Patient arrived from home via [facility] with reports of confusion. Reported right side headache this morning-received Ibuprofen He was taken to his PCP at 1030 where he was observed to have change in mental status

## 2018-10-05 NOTE — Progress Notes (Signed)
Pharmacist Code Stroke Response  Notified to mix tPA at 1609 by Dr. Leonel Ramsay Delivered tPA to RN at 1612, administration started 1617 due to MD obtaining family consent.   tPA dose = 9 mg bolus over 1 minute followed by 81mg  for a total dose of 90 mg over 1 hour  Issues/delays encountered (if applicable):   [name] [name], PharmD, BCPS Clinical Staff Pharmacist Eilene Ghazi [place] 10/05/18 4:18 PM

## 2018-10-06 ENCOUNTER — Encounter (HOSPITAL_COMMUNITY): Payer: Self-pay | Admitting: Internal Medicine

## 2018-10-06 ENCOUNTER — Inpatient Hospital Stay (HOSPITAL_COMMUNITY): Payer: 59

## 2018-10-06 DIAGNOSIS — J329 Chronic sinusitis, unspecified: Secondary | ICD-10-CM | POA: Diagnosis present

## 2018-10-06 DIAGNOSIS — I6350 Cerebral infarction due to unspecified occlusion or stenosis of unspecified cerebral artery: Secondary | ICD-10-CM

## 2018-10-06 DIAGNOSIS — R410 Disorientation, unspecified: Secondary | ICD-10-CM | POA: Diagnosis present

## 2018-10-06 HISTORY — DX: Chronic sinusitis, unspecified: J32.9

## 2018-10-06 LAB — CSF CELL COUNT WITH DIFFERENTIAL
Eosinophils, CSF: 0 % (ref 0–1)
Lymphs, CSF: 93 % — ABNORMAL HIGH (ref 40–80)
Monocyte-Macrophage-Spinal Fluid: 6 % — ABNORMAL LOW (ref 15–45)
Other Cells, CSF: 1
RBC Count, CSF: 0 /mm3
Segmented Neutrophils-CSF: 0 % (ref 0–6)
Tube #: 3
WBC CSF: 51 /mm3 — AB (ref 0–5)

## 2018-10-06 LAB — LIPID PANEL
Cholesterol: 101 mg/dL (ref 0–200)
HDL: 30 mg/dL — ABNORMAL LOW (ref >40)
LDL Cholesterol: 55 mg/dL (ref 0–99)
Total CHOL/HDL Ratio: 3.4 RATIO
Triglycerides: 79 mg/dL (ref <150)
VLDL: 16 mg/dL (ref 0–40)

## 2018-10-06 LAB — HIV ANTIBODY (ROUTINE TESTING W REFLEX): HIV Screen 4th Generation wRfx: NONREACTIVE

## 2018-10-06 LAB — CBC
HCT: 41 % (ref 39.0–52.0)
Hemoglobin: 14.1 g/dL (ref 13.0–17.0)
MCH: 29.9 pg (ref 26.0–34.0)
MCHC: 34.4 g/dL (ref 30.0–36.0)
MCV: 86.9 fL (ref 80.0–100.0)
Platelets: 316 K/uL (ref 150–400)
RBC: 4.72 MIL/uL (ref 4.22–5.81)
RDW: 12.6 % (ref 11.5–15.5)
WBC: 11.4 K/uL — ABNORMAL HIGH (ref 4.0–10.5)
nRBC: 0 % (ref 0.0–0.2)

## 2018-10-06 LAB — HEMOGLOBIN A1C
Hgb A1c MFr Bld: 6.8 % — ABNORMAL HIGH (ref 4.8–5.6)
Mean Plasma Glucose: 148.46 mg/dL

## 2018-10-06 LAB — GLUCOSE, CSF: Glucose, CSF: 68 mg/dL (ref 40–70)

## 2018-10-06 LAB — ECHOCARDIOGRAM COMPLETE
HEIGHTINCHES: 71 in
Weight: 3601.43 oz

## 2018-10-06 LAB — URINE CULTURE: Culture: NO GROWTH

## 2018-10-06 LAB — PROTEIN, CSF: Total  Protein, CSF: 72 mg/dL — ABNORMAL HIGH (ref 15–45)

## 2018-10-06 LAB — C-REACTIVE PROTEIN: CRP: <0.8 mg/dL (ref <1.0)

## 2018-10-06 LAB — SEDIMENTATION RATE: Sed Rate: 4 mm/hr (ref 0–16)

## 2018-10-06 MED ORDER — CHLORHEXIDINE GLUCONATE 0.12% ORAL RINSE (MEDLINE KIT): 15.0000 mL | Freq: Two times a day (BID) | OROMUCOSAL | Status: DC

## 2018-10-06 MED ORDER — LIDOCAINE HCL (PF) 1 % IJ SOLN
INTRAMUSCULAR | Status: AC
  Administered 2018-10-06: 1 mL via SUBCUTANEOUS
  Filled 2018-10-06: qty 10

## 2018-10-06 MED ORDER — QUETIAPINE FUMARATE 25 MG PO TABS
25.0000 mg | ORAL_TABLET | Freq: Four times a day (QID) | ORAL | Status: DC | PRN
  Administered 2018-10-07: 25 mg via ORAL
  Filled 2018-10-06: qty 1

## 2018-10-06 MED ORDER — DEXTROSE 5 % IV SOLN
10.0000 mg/kg | Freq: Three times a day (TID) | INTRAVENOUS | Status: DC
  Administered 2018-10-06 – 2018-10-09 (×8): 755 mg via INTRAVENOUS
  Filled 2018-10-06 (×9): qty 15.1

## 2018-10-06 MED ORDER — GADOBUTROL 1 MMOL/ML IV SOLN
10.0000 mL | Freq: Once | INTRAVENOUS | Status: AC | PRN
  Administered 2018-10-06: 10 mL via INTRAVENOUS

## 2018-10-06 MED ORDER — LORAZEPAM 2 MG/ML IJ SOLN
INTRAMUSCULAR | Status: AC
  Administered 2018-10-06: 1 mg via INTRAVENOUS
  Filled 2018-10-06: qty 1

## 2018-10-06 MED ORDER — DOXYCYCLINE HYCLATE 100 MG PO TABS
100.0000 mg | ORAL_TABLET | Freq: Two times a day (BID) | ORAL | Status: DC
  Administered 2018-10-06: 100 mg via ORAL
  Filled 2018-10-06 (×2): qty 1

## 2018-10-06 MED ORDER — LORAZEPAM 2 MG/ML IJ SOLN
1.0000 mg | Freq: Once | INTRAMUSCULAR | Status: AC | PRN
  Administered 2018-10-06: 1 mg via INTRAVENOUS

## 2018-10-06 MED ORDER — LORAZEPAM 2 MG/ML IJ SOLN
1.0000 mg | Freq: Once | INTRAMUSCULAR | Status: DC | PRN
  Filled 2018-10-06: qty 1

## 2018-10-06 MED ORDER — VANCOMYCIN HCL IN DEXTROSE 1-5 GM/200ML-% IV SOLN
1000.0000 mg | Freq: Three times a day (TID) | INTRAVENOUS | Status: DC
  Administered 2018-10-07 – 2018-10-08 (×4): 1000 mg via INTRAVENOUS
  Filled 2018-10-06 (×5): qty 200

## 2018-10-06 MED ORDER — SODIUM CHLORIDE 0.9 % IV SOLN
2.0000 g | Freq: Two times a day (BID) | INTRAVENOUS | Status: DC
  Administered 2018-10-06 – 2018-10-09 (×6): 2 g via INTRAVENOUS
  Filled 2018-10-06 (×7): qty 20

## 2018-10-06 MED ORDER — LORAZEPAM 2 MG/ML IJ SOLN
INTRAMUSCULAR | Status: AC
  Filled 2018-10-06: qty 1

## 2018-10-06 MED ORDER — INSULIN ASPART 100 UNIT/ML ~~LOC~~ SOLN
0.0000 [IU] | Freq: Three times a day (TID) | SUBCUTANEOUS | Status: DC
  Administered 2018-10-07 (×2): 2 [IU] via SUBCUTANEOUS
  Administered 2018-10-08: 3 [IU] via SUBCUTANEOUS
  Administered 2018-10-08 – 2018-10-09 (×2): 2 [IU] via SUBCUTANEOUS
  Administered 2018-10-09: 3 [IU] via SUBCUTANEOUS

## 2018-10-06 MED ORDER — LORAZEPAM 2 MG/ML IJ SOLN
1.0000 mg | Freq: Once | INTRAMUSCULAR | Status: DC
  Administered 2018-10-06: 1 mg via INTRAVENOUS

## 2018-10-06 MED ORDER — HYDROCODONE-ACETAMINOPHEN 5-325 MG PO TABS
1.0000 | ORAL_TABLET | Freq: Four times a day (QID) | ORAL | Status: DC | PRN
  Administered 2018-10-06 – 2018-10-07 (×3): 2
  Filled 2018-10-06 (×3): qty 2

## 2018-10-06 MED ORDER — ORAL CARE MOUTH RINSE: 15.0000 mL | OROMUCOSAL | Status: DC

## 2018-10-06 MED ORDER — VANCOMYCIN HCL 10 G IV SOLR
2000.0000 mg | Freq: Once | INTRAVENOUS | Status: AC
  Administered 2018-10-06: 2000 mg via INTRAVENOUS
  Filled 2018-10-06: qty 2000

## 2018-10-06 MED ORDER — MORPHINE SULFATE (PF) 2 MG/ML IV SOLN
2.0000 mg | INTRAVENOUS | Status: DC | PRN
  Filled 2018-10-06: qty 1

## 2018-10-06 MED ORDER — HYDROMORPHONE HCL 1 MG/ML IJ SOLN: 0.5000 mg | INTRAMUSCULAR | Status: DC | PRN

## 2018-10-06 NOTE — Consult Note (Signed)
Medical Consultation   [name]  OVF:[id number]  DOB: 1951/03/13  DOA: 10/05/2018  PCP: Curly Rim, MD   Outpatient Specialists: Mauricio Po - cardiology; [facility]   Requesting physician: Jaynee Eagles - neurology  Reason for consultation: Presented last night with agitation and confusion.  Acute sinusitis, given steroids and became agitated.  He did get tPA for presumed stroke, due for MRI today.  If negative MRI, patient has non-neurologic issue and neuro team requests [facility] assuming care of the patient.  This is reasonable if MRI is negative, and Dr. Jaynee Eagles will call back to confirm.   History of Present Illness: [name] is an 68 y.o. male with h/o HTN; HLD; and DM presenting with AMS.  2 weeks ago, he got a cold with cough.  He was placed on Augmentin.  He was also given a steroid shot - this was a week ago.  He felt a bit better Monday.  Tuesday, he was feeling bad again.  Wednesdya, he was complaining of severe L ear and neck pain with throbbing.  They thought it was sinusitis.  Headache was worsening.  He went back to PCP Wednesday - no PNA.  Wednesday night, he was screaming with pain in his ear, headache.  Thursday, he went back and had negative flu and CXR.  He was taking 4 Ibuprofen and then alternating with Tylenol.  He got another steroid injection on Thursday about 1030 and then had antibiotic changed to doxycycline.  He had sharp and throbbing pain again that afternoon.  About 1230, he was unresponsive with a blank stare.  He was unable to speak to his wife or son.  He was brought to the ER and he was able to answer questions in the ER but continued to have his pain.  They did a CT and the neurologist was asking questions - again with expressive aphasia.  They decided to call code stroke.  He received tPA.  Since then, he has continued to be combative, confused, frustrated due to his pain - this was intermittent.  Fever went up last night and BP went up to 103.  He  was given valium and hydrocodone and he is now resting comfortably.   Review of Systems:  ROS Unable to perform   Past Medical History: Past Medical History:  Diagnosis Date  . Decreased hearing   . Diabetes mellitus without complication ([place])   . History of adenomatous polyp of colon   . Hyperlipidemia   . Hypertension   . Lumbar spondylosis   . Obesity   . Vitamin D deficiency     Past Surgical History: Past Surgical History:  Procedure Laterality Date  . BACK SURGERY    . COLONOSCOPY  07/21/2004, 04/03/2010  . EYE MUSCLE REPAIR    . [place]  . LUMBAR FUSION  04/17/2009   L4-L5  . LUMBAR FUSION  1992   L4-L5     Allergies:  No Known Allergies   Social History:  reports that he has never smoked. He has never used smokeless tobacco. He reports current alcohol use. He reports that he does not use drugs.   Family History: Family History  Problem Relation Age of Onset  . Heart failure Mother   . Hypertension Mother   . Hypertension Father   . Breast cancer Sister   . Prostate cancer Brother   . Heart attack  Brother 72  . Heart failure Maternal Grandmother   . Heart failure Maternal Grandfather   . Heart failure Paternal Grandmother   . Heart failure Paternal Grandfather       Physical Exam: Vitals:   10/06/18 1300 10/06/18 1400 10/06/18 1500 10/06/18 1600  BP: 108/60 126/73 (!) 148/65 122/68  Pulse: 96 92 89 88  Resp: 19 20 16 19   Temp:    98.8 F (37.1 C)  TempSrc:    Oral  SpO2: 91% 95% 97% 97%  Weight:      Height:        Constitutional: Somnolent, opens eyes to voice and answers a few simple questions and then lapses back to sleep, not in any acute distress. Eyes:  EOMI, irises appear normal, anicteric sclera ENMT: external ears and nose appear normal, Lips appear normal Neck: neck appears normal, no masses, normal ROM, no thyromegaly, no JVD  CVS: S1-S2 clear, mild tachycardia, no murmur rubs or gallops, no LE edema, normal  pedal pulses  Respiratory:  clear to auscultation bilaterally, no wheezing, rales or rhonchi. Respiratory effort normal. No accessory muscle use.  Abdomen: soft nontender, nondistended, normal bowel sounds, no hepatosplenomegaly, no hernias  Musculoskeletal: : no cyanosis, clubbing or edema noted bilaterally Neuro: unable to perform Psych: somnolent, opens eyes to voice and answers a few questions and then becomes nonsensical Skin: no rashes or lesions or ulcers, no induration or nodules    Data reviewed:  I have personally reviewed the recent labs and imaging studies  Pertinent Labs:   Lipids: 101/30/55/79 WBC 11.4, otherwise WNL Lactate 1.4 A1c 6.8 HIV negative Normal CMP other than glucose 115 on 1/31   Inpatient Medications:   Scheduled Meds: .  stroke: mapping our early stages of recovery book   Does not apply Once  . lidocaine (PF)      . LORazepam      . pantoprazole (PROTONIX) IV  40 mg Intravenous QHS   Continuous Infusions: . sodium chloride    . sodium chloride 50 mL/hr (10/06/18 1600)  . clevidipine 1 mg/hr (10/06/18 0346)     Radiological Exams on Admission: Ct Angio Head W Or Wo Contrast  Result Date: 10/05/2018 CLINICAL DATA:  Aphasia which began several hours ago. EXAM: CT ANGIOGRAPHY HEAD AND NECK TECHNIQUE: Multidetector CT imaging of the head and neck was performed using the standard protocol during bolus administration of intravenous contrast. Multiplanar CT image reconstructions and MIPs were obtained to evaluate the vascular anatomy. Carotid stenosis measurements (when applicable) are obtained utilizing NASCET criteria, using the distal internal carotid diameter as the denominator. CONTRAST:  64mL ISOVUE-370 IOPAMIDOL (ISOVUE-370) INJECTION 76% COMPARISON:  CT head earlier today. FINDINGS: There is significant motion degradation. Small or subtle abnormalities could be overlooked. CTA NECK FINDINGS Aortic arch: Standard branching. Imaged portion shows no  evidence of aneurysm or dissection. No significant stenosis of the major arch vessel origins. Right carotid system: Patency is established. Presence or absence of flow reducing lesion at the bifurcation difficult to confirm. Left carotid system: Patency is established. Presence or absence of a flow reducing lesion at the bifurcation difficult to confirm. Vertebral arteries: RIGHT vertebral is the dominant contributor. LEFT vertebral is diminutive. Assessment of ostial narrowing or disease in the neck is difficult to confirm. Skeleton: Spondylosis. Other neck: No gross mass lesion. Upper chest: No visible pneumothorax. Review of the MIP images confirms the above findings CTA HEAD FINDINGS Anterior circulation: No large vessel occlusion. The distal ICA, proximal ACA, and  proximal MCA segments appear patent. Posterior circulation: No large vessel occlusion. RIGHT vertebral dominant contributor to the basilar. Venous sinuses: Grossly patent. Anatomic variants: Not established. Delayed phase: Not performed. Review of the MIP images confirms the above findings IMPRESSION: 1. Severely motion degraded examination. No extracranial or intracranial flow reducing lesion is observed. Patency of the extracranial and intracranial circulation is established. 2. These results were discussed directly at the time of interpretation on 10/05/2018 at 4:45 pm to Dr. Roland Rack , who verbally acknowledged these results. Electronically Signed   By: Staci Righter M.D.   On: 10/05/2018 17:04   Dg Chest 2 View  Result Date: 10/05/2018 CLINICAL DATA:  Acute onset altered mental status today. EXAM: CHEST - 2 VIEW COMPARISON:  None. FINDINGS: The lungs are clear. Elevation of the right hemidiaphragm relative to the left noted. Heart size is normal. No pneumothorax or pleural fluid. No acute or focal bony abnormality. IMPRESSION: No acute disease. Electronically Signed   By: Inge Rise M.D.   On: 10/05/2018 16:12   Ct Head Wo  Contrast  Result Date: 10/06/2018 CLINICAL DATA:  Neuro deficit(s), subacute neuro change, patient less responsive, more sleepy, running fever (100.68F), confused. EXAM: CT HEAD WITHOUT CONTRAST TECHNIQUE: Contiguous axial images were obtained from the base of the skull through the vertex without intravenous contrast. COMPARISON:  10/05/2018 CT head and CTA head. FINDINGS: Brain: No evidence of acute infarction, hemorrhage, hydrocephalus, extra-axial collection or mass lesion/mass effect. Stable chronic microvascular ischemic changes and volume loss of the brain. Vascular: Persistent contrast enhancement of the intracranial vessels. Skull: Normal. Negative for fracture or focal lesion. Sinuses/Orbits: Diffuse paranasal sinus mucosal thickening and fluid levels within the maxillary sinuses. Normal aeration of mastoid air cells. Orbits are unremarkable. Other: None. IMPRESSION: 1. No acute intracranial abnormality identified. 2. Stable chronic microvascular ischemic changes and volume loss of the brain. 3. Sinus disease with maxillary fluid levels which may represent acute sinusitis. Electronically Signed   By: Kristine Garbe M.D.   On: 10/06/2018 01:18   Ct Head Wo Contrast  Result Date: 10/05/2018 CLINICAL DATA:  Headache and left-sided facial pain for 1 week. EXAM: CT HEAD WITHOUT CONTRAST CT MAXILLOFACIAL WITHOUT CONTRAST TECHNIQUE: Multidetector CT imaging of the head and maxillofacial structures were performed using the standard protocol without intravenous contrast. Multiplanar CT image reconstructions of the maxillofacial structures were also generated. COMPARISON:  None. FINDINGS: CT HEAD FINDINGS Brain: Mild diffuse cortical atrophy is noted. Mild chronic ischemic white matter disease is noted. No mass effect or midline shift is noted. Ventricular size is within normal limits. There is no evidence of mass lesion, hemorrhage or acute infarction. Vascular: No hyperdense vessel or unexpected  calcification. Skull: Normal. Negative for fracture or focal lesion. Other: None. CT MAXILLOFACIAL FINDINGS Osseous: No fracture or mandibular dislocation. No destructive process. Orbits: Negative. No traumatic or inflammatory finding. Sinuses: Bilateral maxillary sinusitis is noted with air-fluid levels. Soft tissues: Negative. IMPRESSION: Mild diffuse cortical atrophy. Mild chronic ischemic white matter disease. No acute intracranial abnormality seen. Bilateral maxillary sinusitis is noted. Electronically Signed   By: Marijo Conception, M.D.   On: 10/05/2018 15:50   Ct Angio Neck W Or Wo Contrast  Result Date: 10/05/2018 CLINICAL DATA:  Aphasia which began several hours ago. EXAM: CT ANGIOGRAPHY HEAD AND NECK TECHNIQUE: Multidetector CT imaging of the head and neck was performed using the standard protocol during bolus administration of intravenous contrast. Multiplanar CT image reconstructions and MIPs were obtained to evaluate the  vascular anatomy. Carotid stenosis measurements (when applicable) are obtained utilizing NASCET criteria, using the distal internal carotid diameter as the denominator. CONTRAST:  18mL ISOVUE-370 IOPAMIDOL (ISOVUE-370) INJECTION 76% COMPARISON:  CT head earlier today. FINDINGS: There is significant motion degradation. Small or subtle abnormalities could be overlooked. CTA NECK FINDINGS Aortic arch: Standard branching. Imaged portion shows no evidence of aneurysm or dissection. No significant stenosis of the major arch vessel origins. Right carotid system: Patency is established. Presence or absence of flow reducing lesion at the bifurcation difficult to confirm. Left carotid system: Patency is established. Presence or absence of a flow reducing lesion at the bifurcation difficult to confirm. Vertebral arteries: RIGHT vertebral is the dominant contributor. LEFT vertebral is diminutive. Assessment of ostial narrowing or disease in the neck is difficult to confirm. Skeleton:  Spondylosis. Other neck: No gross mass lesion. Upper chest: No visible pneumothorax. Review of the MIP images confirms the above findings CTA HEAD FINDINGS Anterior circulation: No large vessel occlusion. The distal ICA, proximal ACA, and proximal MCA segments appear patent. Posterior circulation: No large vessel occlusion. RIGHT vertebral dominant contributor to the basilar. Venous sinuses: Grossly patent. Anatomic variants: Not established. Delayed phase: Not performed. Review of the MIP images confirms the above findings IMPRESSION: 1. Severely motion degraded examination. No extracranial or intracranial flow reducing lesion is observed. Patency of the extracranial and intracranial circulation is established. 2. These results were discussed directly at the time of interpretation on 10/05/2018 at 4:45 pm to Dr. Roland Rack , who verbally acknowledged these results. Electronically Signed   By: Staci Righter M.D.   On: 10/05/2018 17:04   Mr Jodene Nam Neck W Wo Contrast  Result Date: 10/06/2018 CLINICAL DATA:  Patient became confused after administration of high-dose steroids. EXAM: MRI HEAD WITHOUT CONTRAST MRA NECK WITHOUT AND WITH CONTRAST TECHNIQUE: Multiplanar, multiecho pulse sequences of the brain and surrounding structures were obtained without intravenous contrast. Angiographic images of the neck were obtained using MRA technique with and without intravenous contrast. Carotid stenosis measurements (when applicable) are obtained utilizing NASCET criteria, using the distal internal carotid diameter as the denominator. CONTRAST:  Gadavist 10 mL. COMPARISON:  CT head without contrast, CT maxillofacial without contrast, CTA head neck with contrast performed 10/05/2018. FINDINGS: MRI HEAD FINDINGS Brain: No acute stroke, hemorrhage, mass lesion, hydrocephalus, or extra-axial fluid. Mild atrophy. Mild subcortical and periventricular T2 and FLAIR hyperintensities, likely chronic microvascular ischemic change.  Prominent ventricles, likely central atrophy. Vascular: Patency of intracranial vasculature is established based on flow void. Skull and upper cervical spine: No acute findings. Sinuses/Orbits: Significant paranasal sinus disease, layering BILATERAL maxillary fluid, also mucosal thickening in the ethmoid, sphenoid, and frontal sinuses. Negative orbits. Other: None. MRA NECK FINDINGS Conventional branching of the great vessels from the arch. No proximal stenosis. Carotid bifurcations are free of disease. There is moderate dolichoectasia of the cervical ICA segments. No evidence of carotid stenosis or fibromuscular dysplasia. No carotid dissection. RIGHT vertebral is dominant but both vertebrals are patent. Rudimentary connection of the LEFT vertebral to the basilar. RIGHT vertebral is widely patent throughout its vertebral segments and is the dominant contributor to the basilar. No dissection or ostial narrowing. IMPRESSION: MRI brain demonstrates no acute or focal abnormality. Specifically no evidence for acute stroke. Chronic and acute sinusitis. No extracranial stenosis or dissection.  Moderate dolichoectasia. Electronically Signed   By: Staci Righter M.D.   On: 10/06/2018 14:18   Mr Brain Wo Contrast (neuro Protocol)  Result Date: 10/06/2018 CLINICAL DATA:  Patient  became confused after administration of high-dose steroids. EXAM: MRI HEAD WITHOUT CONTRAST MRA NECK WITHOUT AND WITH CONTRAST TECHNIQUE: Multiplanar, multiecho pulse sequences of the brain and surrounding structures were obtained without intravenous contrast. Angiographic images of the neck were obtained using MRA technique with and without intravenous contrast. Carotid stenosis measurements (when applicable) are obtained utilizing NASCET criteria, using the distal internal carotid diameter as the denominator. CONTRAST:  Gadavist 10 mL. COMPARISON:  CT head without contrast, CT maxillofacial without contrast, CTA head neck with contrast performed  10/05/2018. FINDINGS: MRI HEAD FINDINGS Brain: No acute stroke, hemorrhage, mass lesion, hydrocephalus, or extra-axial fluid. Mild atrophy. Mild subcortical and periventricular T2 and FLAIR hyperintensities, likely chronic microvascular ischemic change. Prominent ventricles, likely central atrophy. Vascular: Patency of intracranial vasculature is established based on flow void. Skull and upper cervical spine: No acute findings. Sinuses/Orbits: Significant paranasal sinus disease, layering BILATERAL maxillary fluid, also mucosal thickening in the ethmoid, sphenoid, and frontal sinuses. Negative orbits. Other: None. MRA NECK FINDINGS Conventional branching of the great vessels from the arch. No proximal stenosis. Carotid bifurcations are free of disease. There is moderate dolichoectasia of the cervical ICA segments. No evidence of carotid stenosis or fibromuscular dysplasia. No carotid dissection. RIGHT vertebral is dominant but both vertebrals are patent. Rudimentary connection of the LEFT vertebral to the basilar. RIGHT vertebral is widely patent throughout its vertebral segments and is the dominant contributor to the basilar. No dissection or ostial narrowing. IMPRESSION: MRI brain demonstrates no acute or focal abnormality. Specifically no evidence for acute stroke. Chronic and acute sinusitis. No extracranial stenosis or dissection.  Moderate dolichoectasia. Electronically Signed   By: Staci Righter M.D.   On: 10/06/2018 14:18   Ct Head Code Stroke Wo Contrast  Result Date: 10/05/2018 CLINICAL DATA:  Code stroke. Imbalance and confusion. Headache and facial pain. EXAM: CT HEAD WITHOUT CONTRAST TECHNIQUE: Contiguous axial images were obtained from the base of the skull through the vertex without intravenous contrast. COMPARISON:  Head CT 10/05/2018 at 1520 hours FINDINGS: Brain: No definite acute infarct, intracranial hemorrhage, mass, midline shift, or extra-axial fluid collection is identified within  limitations of mild to slightly moderate motion artifact. Slight asymmetric hypoattenuation in the central portion of the right cerebellar hemispheres favored to be artifactual. Mild cerebral atrophy is again noted. Cerebral white matter hypodensities are nonspecific but compatible with mild chronic small vessel ischemic disease. Vascular: Mild calcified atherosclerosis at the skull base. No hyperdense vessel. Skull: No fracture or focal osseous lesion. Sinuses/Orbits: Prominent bilateral maxillary sinus fluid levels, unchanged. Mild bilateral ethmoid air cell mucosal thickening. Clear mastoid air cells. Unremarkable orbits. Other: None. ASPECTS [facility] Stroke Program Early CT Score) Not scored with this history. IMPRESSION: 1. No evidence of acute intracranial abnormality within limitations of motion artifact. 2. Mild chronic small vessel ischemic disease and cerebral atrophy. These results were communicated to Dr. Leonel Ramsay at 4:24 pm on 10/05/2018 by text page via the [facility] messaging system. Electronically Signed   By: Logan Bores M.D.   On: 10/05/2018 16:24   Ct Maxillofacial Wo Contrast  Result Date: 10/05/2018 CLINICAL DATA:  Headache and left-sided facial pain for 1 week. EXAM: CT HEAD WITHOUT CONTRAST CT MAXILLOFACIAL WITHOUT CONTRAST TECHNIQUE: Multidetector CT imaging of the head and maxillofacial structures were performed using the standard protocol without intravenous contrast. Multiplanar CT image reconstructions of the maxillofacial structures were also generated. COMPARISON:  None. FINDINGS: CT HEAD FINDINGS Brain: Mild diffuse cortical atrophy is noted. Mild chronic ischemic white matter disease is  noted. No mass effect or midline shift is noted. Ventricular size is within normal limits. There is no evidence of mass lesion, hemorrhage or acute infarction. Vascular: No hyperdense vessel or unexpected calcification. Skull: Normal. Negative for fracture or focal lesion. Other: None. CT  MAXILLOFACIAL FINDINGS Osseous: No fracture or mandibular dislocation. No destructive process. Orbits: Negative. No traumatic or inflammatory finding. Sinuses: Bilateral maxillary sinusitis is noted with air-fluid levels. Soft tissues: Negative. IMPRESSION: Mild diffuse cortical atrophy. Mild chronic ischemic white matter disease. No acute intracranial abnormality seen. Bilateral maxillary sinusitis is noted. Electronically Signed   By: Marijo Conception, M.D.   On: 10/05/2018 15:50   Dg Fluoro Guide Lumbar Puncture  Result Date: 10/06/2018 Margarette Canada, MD     10/06/2018  6:07 PM LP performed at L3-4 OP - 25 cm H2O 10 cc's clear colorless fluid obtained without complication   Impression/Recommendations Principal Problem:   Delirium Active Problems:   DM (diabetes mellitus), type 2 with renal complications ([place])   Hyperlipidemia   Sinusitis  Delirium -Patient presenting with aphasia concerning for acute CVA -He received tPA -MRI performed and negative for CVA -Neuro has requested that [facility] assume care -Family reports fever and AMS with prior antibiotics; given symptoms and despite normal MRI, this raises the question of partially-treated bacterial meningitis vs. Viral meningitis/encephalitis -As a result, Dr. Jaynee Eagles called IR to arrange for LP tonight.   -LP was just performed with normal OP and clear colorless fluid; studies pending -Assuming negative LP, most likely etiologies for delirium include acute sinusitis as well as steroid-induced psychosis -Will transfer to neuro-SDU; he does not appear to require ongoing ICU care -If concern for CSF infection, needs broadening of antibiotics (currently on Doxy) -Hopefully will not continue to need a sitter, as he can be chemically restrained if needed at this time with Seroquel/morphine  Sinusitis -Patient was having significant and uncontrolled ear pain -He was resting at the time of my evaluation -Will add morphine prn -There does not appear to  be a role for ENT evaluation at this time, as there is no apparent surgical intervention needed -Consider broadening coverage with cephalosporin if LP negative  HLD -Continue Crestor  DM -A1c 6.8 -hold Glucophage -Cover with moderate-scale SSI   Thank you for this consultation.  Our [facility] hospitalist team will follow the patient with you.   Time Spent: 58 minutes  Karmen Bongo M.D. [facility] 10/06/2018, 6:13 PM

## 2018-10-06 NOTE — Progress Notes (Signed)
Patient arrived with spouse at bedside. Oriented to room and unit. A & O without complaints. Bilateral IV infusing. Skin assessment complete with another nurse. Monitors placed.

## 2018-10-06 NOTE — Progress Notes (Signed)
Patient is wildly combative. Doctor contacted about patients constant noncompliance with medical care. Patient is refusing Blood pressure, HR monitoring, refuses to stay in bed, and is very confused.   PER MD will give Seroquel and continue to monitor. MD aware that patient is fighting against vitals.

## 2018-10-06 NOTE — Progress Notes (Signed)
Patient is running a fever. PRN Tylenol given, ice applied, fan in room.

## 2018-10-06 NOTE — Progress Notes (Signed)
STROKE TEAM PROGRESS NOTE   HISTORY OF PRESENT ILLNESS (per record) [name] is an 68 y.o. male with PMH HTN, HLD, DM who presented to [facility] ED for c/o confusion and balance issues. Code stroke initiated in ED.   Per report: Patient has had a sinus infection for for over a week. He finished augmentin and has received two steroid shots as well. Patient was at his PCP where he had 80mg  of steroids and then 1.5 hours later developed confusion and slowness that had improved by the time he arrived to ED, but he never returned back to baseline. While he was in ED he became worse again and code stroke was initiated. Patient also complained of shooting left ear pain and headache. Denies prior stroke history. Denies slurred speech, SOB, visual changes.   ED course:  CTA: no LVO CTH: no hemorrhage BP: 148/98 BG: 115   Date last known well: 10/05/2018  Time last known well: 1200, first known abnormal at 12:15 tPA Given: Yes @1615  Modified Rankin: Rankin Score=0 NIHSS:5    SUBJECTIVE (INTERVAL HISTORY) His family is at the bedside, they are very attentive to patient and his every grimace.  This is a patient who is had acute sinusitis is been treated with 2 rounds of antibiotics continued to have symptoms, he was seen at his primary care and was given 80 mg of steroids and a few hours later he became very confused.  He reports left ear pain as well as a headache.  CT of the head and CT angiograms were negative except for fluid levels consistent with acute maxillary sinusitis.  Patient nonfocal, I suspect patient did not have a stroke and he was confused due to fever of 103 due to sinusitis as well as high-dose steroids.  Pending MRI of the brain.  We will also repeat CT of the head at 4:00 which is 24 hours post TPA.    OBJECTIVE Vitals:   10/06/18 0500 10/06/18 0600 10/06/18 0700 10/06/18 0753  BP: 134/71 131/73 (!) 142/60 114/64  Pulse: (!) 109 (!) 101 (!) 103 (!) 108  Resp: (!) 24 19 (!) 22  20  Temp:    100.3 F (37.9 C)  TempSrc:    Oral  SpO2: 94% 97% 98% 96%  Weight:      Height:        CBC:  Recent Labs  Lab 10/05/18 1447 10/06/18 0520  WBC 8.3 11.4*  NEUTROABS 5.1  --   HGB 15.2 14.1  HCT 45.8 41.0  MCV 90.0 86.9  PLT 337 401    Basic Metabolic Panel:  Recent Labs  Lab 10/05/18 1447  NA 137  K 4.2  CL 104  CO2 25  GLUCOSE 115*  BUN 14  CREATININE 1.02  CALCIUM 9.4    Lipid Panel:     Component Value Date/Time   CHOL 101 10/06/2018 0520   TRIG 79 10/06/2018 0520   HDL 30 (L) 10/06/2018 0520   CHOLHDL 3.4 10/06/2018 0520   VLDL 16 10/06/2018 0520   LDLCALC 55 10/06/2018 0520   HgbA1c:  Lab Results  Component Value Date   HGBA1C 6.8 (H) 10/06/2018   Urine Drug Screen: No results found for: LABOPIA, COCAINSCRNUR, LABBENZ, AMPHETMU, THCU, LABBARB  Alcohol Level No results found for: ETH  IMAGING   Ct Angio Head W Or Wo Contrast Ct Angio Neck W Or Wo Contrast 10/05/2018 IMPRESSION:  Severely motion degraded examination. No extracranial or intracranial flow reducing lesion is observed.  Patency of the extracranial and intracranial circulation is established.    Dg Chest 2 View 10/05/2018 IMPRESSION:  No acute disease.     Ct Head Wo Contrast 10/06/2018 IMPRESSION:  1. No acute intracranial abnormality identified.  2. Stable chronic microvascular ischemic changes and volume loss of the brain.  3. Sinus disease with maxillary fluid levels which may represent acute sinusitis.     Ct Head Wo Contrast 10/05/2018 IMPRESSION:  Mild diffuse cortical atrophy. Mild chronic ischemic white matter disease. No acute intracranial abnormality seen. Bilateral maxillary sinusitis is noted.    Ct Head Code Stroke Wo Contrast 10/05/2018 IMPRESSION:  1. No evidence of acute intracranial abnormality within limitations of motion artifact.  2. Mild chronic small vessel ischemic disease and cerebral atrophy.     Ct Maxillofacial Wo  Contrast 10/05/2018 IMPRESSION:  Mild diffuse cortical atrophy. Mild chronic ischemic white matter disease. No acute intracranial abnormality seen. Bilateral maxillary sinusitis is noted.    Transthoracic Echocardiogram  00/00/00 Pending     PHYSICAL EXAM Blood pressure 114/64, pulse (!) 108, temperature 100.3 F (37.9 C), temperature source Oral, resp. rate 20, height 5\' 11"  (1.803 m), weight 102.1 kg, SpO2 96 %.    Exam: NAD, pleasant                  Speech:    Speech is normal; fluent and spontaneous with normal comprehension.  Cognition:    The patient is oriented to person, place, and time;     recent and remote memory intact;     language fluent;    Cranial Nerves:    The pupils are equal, round, and reactive to light.Trigeminal sensation is intact and the muscles of mastication are normal. The face is symmetric. The palate elevates in the midline. Hearing intact. Voice is normal. Shoulder shrug is normal. The tongue has normal motion without fasciculations.   Coordination:  No dysmetria  Motor Observation:    No asymmetry, no atrophy, and no involuntary movements noted. Tone:    Normal muscle tone.     Strength:    Strength is V/V in the upper and lower limbs.      Sensation: intact to LT    ASSESSMENT/PLAN [name] is a 68 y.o. male with history of HTN, HLD, recent sinus infection, DM presenting with confusion, slowness and balance issues. tPA Given @1615  Friday 10/05/2018.  Suspected stroke:  MRI pending  Resultant  No deficit  CT head  - Mild diffuse cortical atrophy - no acute findings.  MRI head - pending  MRA Head & Neck - pending  CTA H&N - motion degraded o/w unremarkable  Carotid Doppler - CTA / MRA neck performed - carotid dopplers not indicated.  2D Echo - pending  LDL - 55  HgbA1c - 6.8  UDS - not performed  VTE prophylaxis - SCDs  Diet - NPO  No antithrombotic prior to admission, now on No antithrombotic - post  tPA  Patient counseled to be compliant with his antithrombotic medications  Ongoing aggressive stroke risk factor management  Therapy recommendations:  pending  Disposition:  Pending  Hypertension  Stable - Cleviprex . Permissive hypertension (OK if < 220/120) but gradually normalize in 5-7 days . Odom-term BP goal normotensive  Hyperlipidemia  Lipid lowering medication PTA:  Crestor 10 mg daily  LDL 55, goal < 70  Current lipid lowering medication: NPO  Continue statin at discharge  Diabetes  HgbA1c 6.8, goal < 7.0  Controlled  Other Stroke Risk Factors  Advanced age  ETOH use, advised to drink no more than 1 alcoholic beverage per day.  Obesity, Body mass index is 31.39 kg/m., recommend weight loss, diet and exercise as appropriate     Other Active Problems  Fever 100.3 - Leukocytosis - 11.4  Acute sinusitis - finished recent course of Augmentin - now on Doxycycline Day # 1     Hospital day # 1  Personally examined patient and images, and have participated in and made any corrections needed to history, physical, neuro exam,assessment and plan as stated above.  I have personally obtained the history, evaluated lab date, reviewed imaging studies and agree with radiology interpretations.    Sarina Ill, MD Stroke Neurology   A total of 35 minutes was spent for the care of this patient, spent on counseling patient and family on different diagnostic and therapeutic options, counseling and coordination of care, riskd ans benefits of management, compliance, or risk factor reduction and education.   To contact Stroke Continuity provider, please refer to [email or web address] After hours, contact General Neurology

## 2018-10-06 NOTE — Progress Notes (Signed)
Pharmacy Antibiotic Note  [name] is a 68 y.o. male admitted on 10/05/2018 with meningitis.    Plan: Ceftriaxone 2 g q12 Vanc 2 g x 1 then 1 g q8 Acyclovir 10 mg/kg q8 Monitor renal fx cx vt prn  Height: 5\' 11"  (180.3 cm) Weight: 225 lb 1.4 oz (102.1 kg) IBW/kg (Calculated) : 75.3  Temp (24hrs), Avg:99.5 F (37.5 C), Min:98.8 F (37.1 C), Max:100.3 F (37.9 C)  Recent Labs  Lab 10/05/18 1447 10/05/18 1448 10/05/18 1900 10/06/18 0520  WBC 8.3  --   --  11.4*  CREATININE 1.02  --   --   --   LATICACIDVEN  --  1.1 1.4  --     Estimated Creatinine Clearance: 84.3 mL/min (by C-G formula based on SCr of 1.02 mg/dL).    No Known Allergies  Levester Fresh, PharmD, BCPS, BCCCP Clinical Pharmacist [phone]  Please check AMION for all [place] numbers  10/06/2018 8:11 PM

## 2018-10-06 NOTE — Progress Notes (Signed)
  Echocardiogram 2D Echocardiogram has been performed.  [name] 10/06/2018, 11:28 AM

## 2018-10-06 NOTE — Progress Notes (Signed)
SLP Cancellation Note  Patient Details Name: DELONTA YOHANNES MRN: [id number] DOB: Jan 15, 1951   Cancelled treatment:       Reason Eval/Treat Not Completed: Medical issues which prohibited therapy. Reviewed chart. Note that patient waxing and waning in level of consciousness and agitation. Will hold eval until stable.   Gabriel Rainwater MA, CCC-SLP     Monnica Saltsman Meryl 10/06/2018, 9:46 AM

## 2018-10-06 NOTE — Progress Notes (Signed)
Patient taken to IR for lumbar aspiration via the SWOT nurse and transport tech.  Ativan 1mg  IV brought to procedure room to be given by the SWOT nurse prior  to the procedure to help calm the patient. Vital signs remained stable through procedure and patient returned back to 4N 28.

## 2018-10-06 NOTE — Progress Notes (Signed)
Patient is Less responsive and has an overall decreased level of consciousness. Unsure if actual neuro change or due to Seroquel. MD contacted, order for STAT CT placed, order for 1mg  of ativan given in case of combative behavior during CT.

## 2018-10-06 NOTE — Progress Notes (Signed)
PT Cancellation Note  Patient Details Name: [name] MRN: [id number] DOB: 29-Mar-1951   Cancelled Treatment:    Reason Eval/Treat Not Completed: Active bedrest order Will check back at later time. Please feel free to page number below if pt status changes.   Leighton Roach, PT  Acute Rehab Services  Pager [phone] Office [place] 10/06/2018, 7:42 AM

## 2018-10-06 NOTE — Progress Notes (Signed)
Patients family (currently only the daughter) is growing increasingly anxious. Family is concerned that the patients needs are not being met. I have reassured the family that the health care team is working its utmost to care for the patient and educate the family on temperature/fever management, starting the cleviprex for high BP, normal signs of stroke and the recovery process, confusion and agitation in stroke patients, post-tpa monitoring and all it encompasses, education on pain management in post-tpa strokes and providing the best pain management while not masking signs and symptoms of a stroke, the safety sitter, importance of bed rest and vitals, and much more. Family is still anxious and convinced that the health care team is doing nothing despite her fathers "declining condition". She states "I am sitting here watching my father die, and that's all I can do! Watch him die while you do nothing." I have reassured the daughter, like previously stated educated her, given emotional and physical support to both the daughter and patient, and paged the neurologist on call (Dr.Aroor) multiple times at her request.

## 2018-10-06 NOTE — Progress Notes (Signed)
OT Cancellation Note  Patient Details Name: [name] MRN: [id number] DOB: 1950/11/28   Cancelled Treatment:    Reason Eval/Treat Not Completed: Active bedrest order. Will return as schedule allows. Thank you.  [place], OTR/L Acute Rehab Pager: [phone] Office: [phone] 10/06/2018, 7:07 AM

## 2018-10-06 NOTE — Plan of Care (Signed)
  Problem: Education: Goal: Knowledge of disease or condition will improve Outcome: Progressing   Problem: Coping: Goal: Will verbalize positive feelings about self Outcome: Progressing   

## 2018-10-06 NOTE — Progress Notes (Signed)
I just finished speaking with the wife. The wife claims that the patients fever has been consistent since Wednesday (and maybe even before that). She states "that is when I took it. It was probably there before I took it." She also has communicated that the patients waxing and weaning LOC is how he has been acting prior to his hospitalization. "It is not his baseline but it is what he has been doing."

## 2018-10-06 NOTE — Procedures (Signed)
LP performed at L3-4 OP - 25 cm H2O  10 cc's clear colorless fluid obtained without complication

## 2018-10-07 ENCOUNTER — Inpatient Hospital Stay (HOSPITAL_COMMUNITY): Payer: 59

## 2018-10-07 DIAGNOSIS — G030 Nonpyogenic meningitis: Secondary | ICD-10-CM

## 2018-10-07 DIAGNOSIS — E1129 Type 2 diabetes mellitus with other diabetic kidney complication: Secondary | ICD-10-CM

## 2018-10-07 DIAGNOSIS — E785 Hyperlipidemia, unspecified: Secondary | ICD-10-CM

## 2018-10-07 LAB — GLUCOSE, CAPILLARY
Glucose-Capillary: 148 mg/dL — ABNORMAL HIGH (ref 70–99)
Glucose-Capillary: 111 mg/dL — ABNORMAL HIGH (ref 70–99)
Glucose-Capillary: 135 mg/dL — ABNORMAL HIGH (ref 70–99)
Glucose-Capillary: 138 mg/dL — ABNORMAL HIGH (ref 70–99)

## 2018-10-07 MED ORDER — PANTOPRAZOLE SODIUM 40 MG PO TBEC
40.0000 mg | DELAYED_RELEASE_TABLET | Freq: Every day | ORAL | Status: DC
  Administered 2018-10-08 – 2018-10-09 (×2): 40 mg via ORAL
  Filled 2018-10-07 (×2): qty 1

## 2018-10-07 MED ORDER — HYDROCODONE-ACETAMINOPHEN 5-325 MG PO TABS
1.0000 | ORAL_TABLET | ORAL | Status: DC | PRN
  Administered 2018-10-07 – 2018-10-09 (×8): 1
  Filled 2018-10-07 (×9): qty 1

## 2018-10-07 NOTE — Progress Notes (Signed)
PROGRESS NOTE    [name]  AST:[id number] DOB: [birth year] DOA: 10/05/2018 PCP: Curly Rim, MD      Brief Narrative:  [name] is a 68 y.o. M with hx NIDDM, hyperlipidemia who presents with sinusitis followed by severe ear pain and confusion and brief episodes of aphasia.  Patient was in [place] until 1 week ago, developed sinus pressure and nasal congestion.  Treated with Augmentin, but developed unilateral ear pain, so severe he was crying in pain.  Changed to doxycycline, given IM steroid shot, and then developed confusion, disorientation.  Sent to ER where he initially appeared to clear mentally, but then had recurrence of aphasia, shooting ear pain and marked agitation, combativeness, disorientation.  Given concern for stuttering stroke, tPA was administered and patient was admitted to Neurology service.       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  MRI negative.    At this point, diagnostic explanations for his aphasia and disorientation include stuttering MRI negative stroke or more likely metabolic encephalopathy/delirium in the setting of IM steroids, opiates and aseptic meningitis.  No encephalitis on MRI brain. MRA showed no dissection. Less likely seizures, although this is being considered. -Obtain EEG -Complete stroke work up with echocardiogram, carotid US -PT eval  -Continue aspirin -BP control -Continue home Crestor -Check LDL and HgbA1c    Headache Ear pain Suspected viral meningitis LP obtained and showed >50 WBCs, lymphocytosis in the tube 3, without red cells at all.  Protein slightly up, glucose normal.    Suspect viral meningitis.  Cannot rule out partially treated bacterial meningitis at this time. -Follow CSF culture -Continue empiric vancomycin, ceftriaxone, acyclovir -Add on HSV PCR to CSF -Supportive care with hydrocodone and acetaminophen  Sinusitis Covered by above antibiotics.  Type  2 diabetes, uncomplicated, non-insulin dependent -SSI -Hold home metformin  Other medications -Hold home fluoxetine and benzodiazepine        MDM and disposition: The below labs and imaging reports were reviewed and summarized above.  Medication management as above.  The patient was admitted with encephalopathy, initially concern for stroke, tPA given.  Follow up MRI negative and it appears more likely patient has an encephalopathy from meningitis.  Will continue empiric antibiotics until culture data return, otherwise treat supportively for aseptic meningitis.         DVT prophylaxis: SCDs Code Status: FULL Family Communication: Wife and daughter    Consultants:   Neurology  Procedures:   EEG  MRI brain  MRA head and neck  CTA head and neck  Echocardiogram   Antimicrobials:  Anti-infectives (From admission, onward)   Start     Dose/Rate Route Frequency Ordered Stop   10/07/18 0600  vancomycin (VANCOCIN) IVPB 1000 mg/200 mL premix     1,000 mg 200 mL/hr over 60 Minutes Intravenous Every 8 hours 10/06/18 2010     10/06/18 2200  acyclovir (ZOVIRAX) 755 mg in dextrose 5 % 150 mL IVPB     10 mg/kg  75.3 kg (Ideal) 165.1 mL/hr over 60 Minutes Intravenous Every 8 hours 10/06/18 2010     10/06/18 2015  cefTRIAXone (ROCEPHIN) 2 g in sodium chloride 0.9 % 100 mL IVPB     2 g 200 mL/hr over 30 Minutes Intravenous Every 12 hours 10/06/18 2002     10/06/18 2015  vancomycin (VANCOCIN) 2,000 mg in sodium chloride 0.9 % 500 mL IVPB     2,000 mg 250 mL/hr over 120 Minutes Intravenous  Once 10/06/18 2010 10/06/18 2308   10/06/18 1000  doxycycline (VIBRA-TABS) tablet 100 mg  Status:  Discontinued    Note to Pharmacy:  For 10 days     100 mg Oral 2 times daily 10/06/18 6063 10/06/18 1812       Subjective: Patient is able to state he is in a hospital.  Recognizes family.  Otherwise disoriented and sleepy from lorazepam and oxycodone.  No vomiting, vision changse.   No focal weakness.  No fever, cough.  Objective: Vitals:   10/06/18 2328 10/07/18 0359 10/07/18 0749 10/07/18 1152  BP: (!) 158/81 126/65 (!) 147/81 130/85  Pulse: 90 (!) 103 (!) 102 93  Resp: (!) 21 18 20 20   Temp: 99.6 F (37.6 C) 99.1 F (37.3 C) 98.5 F (36.9 C) 99 F (37.2 C)  TempSrc: Axillary Axillary Oral Oral  SpO2: 96% 97% 95% 95%  Weight:      Height:        Intake/Output Summary (Last 24 hours) at 10/07/2018 1354 Last data filed at 10/07/2018 0160 Gross per 24 hour  Intake 810.66 ml  Output 275 ml  Net 535.66 ml   Filed Weights   10/05/18 1600 10/05/18 1800  Weight: 106.2 kg 102.1 kg    Examination: General appearance:  adult male, sleeping, rousable but not attentive, no obvious distress.   HEENT: Anicteric, conjunctiva pink, lids and lashes normal. No nasal deformity, discharge, epistaxis. No pain to palpation fo the maxilla. Lips moist, dentition normal, OP moist, no oral lesions, hearing seems normal.   Skin: Warm and dry.  no jaundice.  No suspicious rashes or lesions. Cardiac: RRR, nl S1-S2, no murmurs appreciated.  Capillary refill is brisk.  JVP not visible.  No LE edema.  Radial pulses 2+ and symmetric. Respiratory: Normal respiratory rate and rhythm.  CTAB without rales or wheezes. Abdomen: Abdomen soft.  no TTP. No ascites, distension, hepatosplenomegaly.   MSK: No deformities or effusions. Neuro: Awake but less attentive.  EOMI, moves all extremities with 5-/5 strength, sleepy. Speech fluent.    Psych: Sensorium intact and responding to questions, but sleepy, slow to respond, attention diminihsed from medicines.  Affect blunted.         Data Reviewed: I have personally reviewed following labs and imaging studies:  CBC: Recent Labs  Lab 10/05/18 1447 10/06/18 0520  WBC 8.3 11.4*  NEUTROABS 5.1  --   HGB 15.2 14.1  HCT 45.8 41.0  MCV 90.0 86.9  PLT 337 109   Basic Metabolic Panel: Recent Labs  Lab 10/05/18 1447  NA 137  K 4.2  CL  104  CO2 25  GLUCOSE 115*  BUN 14  CREATININE 1.02  CALCIUM 9.4   GFR: Estimated Creatinine Clearance: 84.3 mL/min (by C-G formula based on SCr of 1.02 mg/dL). Liver Function Tests: Recent Labs  Lab 10/05/18 1447  AST 21  ALT 23  ALKPHOS 74  BILITOT 0.6  PROT 7.1  ALBUMIN 3.8   Recent Labs  Lab 10/05/18 1447  LIPASE 35   Recent Labs  Lab 10/05/18 1504  AMMONIA 24   Coagulation Profile: No results for input(s): INR, PROTIME in the last 168 hours. Cardiac Enzymes: No results for input(s): CKTOTAL, CKMB, CKMBINDEX, TROPONINI in the last 168 hours. BNP (last 3 results) No results for input(s): PROBNP in the last 8760 hours. HbA1C: Recent Labs    10/06/18 0520  HGBA1C 6.8*   CBG: Recent Labs  Lab 10/05/18 1436 10/07/18 0613 10/07/18 1236  GLUCAP 102*  148* 111*   Lipid Profile: Recent Labs    10/06/18 0520  CHOL 101  HDL 30*  LDLCALC 55  TRIG 79  CHOLHDL 3.4   Thyroid Function Tests: No results for input(s): TSH, T4TOTAL, FREET4, T3FREE, THYROIDAB in the last 72 hours. Anemia Panel: No results for input(s): VITAMINB12, FOLATE, FERRITIN, TIBC, IRON, RETICCTPCT in the last 72 hours. Urine analysis:    Component Value Date/Time   COLORURINE YELLOW 10/05/2018 1510   APPEARANCEUR CLEAR 10/05/2018 1510   LABSPEC 1.019 10/05/2018 1510   PHURINE 5.0 10/05/2018 1510   GLUCOSEU NEGATIVE 10/05/2018 1510   HGBUR NEGATIVE 10/05/2018 1510   BILIRUBINUR NEGATIVE 10/05/2018 [place] 10/05/2018 1510   PROTEINUR 30 (A) 10/05/2018 1510   NITRITE NEGATIVE 10/05/2018 1510   LEUKOCYTESUR NEGATIVE 10/05/2018 1510   Sepsis Labs: @LABRCNTIP (procalcitonin:4,lacticacidven:4)  ) Recent Results (from the past 240 hour(s))  Blood culture (routine x 2)     Status: None (Preliminary result)   Collection Time: 10/05/18  2:30 PM  Result Value Ref Range Status   Specimen Description BLOOD LEFT ANTECUBITAL  Final   Special Requests   Final    BOTTLES  DRAWN AEROBIC AND ANAEROBIC Blood Culture results may not be optimal due to an inadequate volume of blood received in culture bottles Performed at [facility], [place] [address], [place], [place] [address]    Culture NO GROWTH 2 DAYS  Final   Report Status PENDING  Incomplete  Blood culture (routine x 2)     Status: None (Preliminary result)   Collection Time: 10/05/18  3:05 PM  Result Value Ref Range Status   Specimen Description BLOOD RIGHT HAND  Final   Special Requests   Final    BOTTLES DRAWN AEROBIC ONLY Blood Culture adequate volume Performed at [facility], [place] [address], [place], [place] [address]    Culture NO GROWTH 2 DAYS  Final   Report Status PENDING  Incomplete  Urine culture     Status: None   Collection Time: 10/05/18  3:10 PM  Result Value Ref Range Status   Specimen Description URINE, CLEAN CATCH  Final   Special Requests NONE  Final   Culture   Final    NO GROWTH Performed at [facility], [place] [address], [place], [place] [address]    Report Status 10/06/2018 FINAL  Final  CSF culture     Status: None (Preliminary result)   Collection Time: 10/06/18  6:16 PM  Result Value Ref Range Status   Specimen Description   Final    CSF Performed at [facility] [address], [place], [place] [address]    Special Requests TUBE 2  Final   Gram Stain   Final    WBC PRESENT, PREDOMINANTLY MONONUCLEAR NO ORGANISMS SEEN CYTOSPIN SMEAR    Culture PENDING  Incomplete   Report Status PENDING  Incomplete         Radiology Studies: Ct Angio Head W Or Wo Contrast  Result Date: 10/05/2018 CLINICAL DATA:  Aphasia which began several hours ago. EXAM: CT ANGIOGRAPHY HEAD AND NECK TECHNIQUE: Multidetector CT imaging of the head and neck was performed using the standard protocol during bolus administration of intravenous contrast. Multiplanar CT image reconstructions and MIPs were obtained to evaluate the vascular anatomy. Carotid stenosis  measurements (when applicable) are obtained utilizing NASCET criteria, using the distal internal carotid diameter as the denominator. CONTRAST:  20mL ISOVUE-370 IOPAMIDOL (ISOVUE-370) INJECTION 76%  COMPARISON:  CT head earlier today. FINDINGS: There is significant motion degradation. Small or subtle abnormalities could be overlooked. CTA NECK FINDINGS Aortic arch: Standard branching. Imaged portion shows no evidence of aneurysm or dissection. No significant stenosis of the major arch vessel origins. Right carotid system: Patency is established. Presence or absence of flow reducing lesion at the bifurcation difficult to confirm. Left carotid system: Patency is established. Presence or absence of a flow reducing lesion at the bifurcation difficult to confirm. Vertebral arteries: RIGHT vertebral is the dominant contributor. LEFT vertebral is diminutive. Assessment of ostial narrowing or disease in the neck is difficult to confirm. Skeleton: Spondylosis. Other neck: No gross mass lesion. Upper chest: No visible pneumothorax. Review of the MIP images confirms the above findings CTA HEAD FINDINGS Anterior circulation: No large vessel occlusion. The distal ICA, proximal ACA, and proximal MCA segments appear patent. Posterior circulation: No large vessel occlusion. RIGHT vertebral dominant contributor to the basilar. Venous sinuses: Grossly patent. Anatomic variants: Not established. Delayed phase: Not performed. Review of the MIP images confirms the above findings IMPRESSION: 1. Severely motion degraded examination. No extracranial or intracranial flow reducing lesion is observed. Patency of the extracranial and intracranial circulation is established. 2. These results were discussed directly at the time of interpretation on 10/05/2018 at 4:45 pm to Dr. Roland Rack , who verbally acknowledged these results. Electronically Signed   By: Staci Righter M.D.   On: 10/05/2018 17:04   Dg Chest 2 View  Result Date:  10/05/2018 CLINICAL DATA:  Acute onset altered mental status today. EXAM: CHEST - 2 VIEW COMPARISON:  None. FINDINGS: The lungs are clear. Elevation of the right hemidiaphragm relative to the left noted. Heart size is normal. No pneumothorax or pleural fluid. No acute or focal bony abnormality. IMPRESSION: No acute disease. Electronically Signed   By: Inge Rise M.D.   On: 10/05/2018 16:12   Ct Head Wo Contrast  Result Date: 10/06/2018 CLINICAL DATA:  Neuro deficit(s), subacute neuro change, patient less responsive, more sleepy, running fever (100.25F), confused. EXAM: CT HEAD WITHOUT CONTRAST TECHNIQUE: Contiguous axial images were obtained from the base of the skull through the vertex without intravenous contrast. COMPARISON:  10/05/2018 CT head and CTA head. FINDINGS: Brain: No evidence of acute infarction, hemorrhage, hydrocephalus, extra-axial collection or mass lesion/mass effect. Stable chronic microvascular ischemic changes and volume loss of the brain. Vascular: Persistent contrast enhancement of the intracranial vessels. Skull: Normal. Negative for fracture or focal lesion. Sinuses/Orbits: Diffuse paranasal sinus mucosal thickening and fluid levels within the maxillary sinuses. Normal aeration of mastoid air cells. Orbits are unremarkable. Other: None. IMPRESSION: 1. No acute intracranial abnormality identified. 2. Stable chronic microvascular ischemic changes and volume loss of the brain. 3. Sinus disease with maxillary fluid levels which may represent acute sinusitis. Electronically Signed   By: Kristine Garbe M.D.   On: 10/06/2018 01:18   Ct Head Wo Contrast  Result Date: 10/05/2018 CLINICAL DATA:  Headache and left-sided facial pain for 1 week. EXAM: CT HEAD WITHOUT CONTRAST CT MAXILLOFACIAL WITHOUT CONTRAST TECHNIQUE: Multidetector CT imaging of the head and maxillofacial structures were performed using the standard protocol without intravenous contrast. Multiplanar CT image  reconstructions of the maxillofacial structures were also generated. COMPARISON:  None. FINDINGS: CT HEAD FINDINGS Brain: Mild diffuse cortical atrophy is noted. Mild chronic ischemic white matter disease is noted. No mass effect or midline shift is noted. Ventricular size is within normal limits. There is no evidence of mass lesion, hemorrhage or acute  infarction. Vascular: No hyperdense vessel or unexpected calcification. Skull: Normal. Negative for fracture or focal lesion. Other: None. CT MAXILLOFACIAL FINDINGS Osseous: No fracture or mandibular dislocation. No destructive process. Orbits: Negative. No traumatic or inflammatory finding. Sinuses: Bilateral maxillary sinusitis is noted with air-fluid levels. Soft tissues: Negative. IMPRESSION: Mild diffuse cortical atrophy. Mild chronic ischemic white matter disease. No acute intracranial abnormality seen. Bilateral maxillary sinusitis is noted. Electronically Signed   By: Marijo Conception, M.D.   On: 10/05/2018 15:50   Ct Angio Neck W Or Wo Contrast  Result Date: 10/05/2018 CLINICAL DATA:  Aphasia which began several hours ago. EXAM: CT ANGIOGRAPHY HEAD AND NECK TECHNIQUE: Multidetector CT imaging of the head and neck was performed using the standard protocol during bolus administration of intravenous contrast. Multiplanar CT image reconstructions and MIPs were obtained to evaluate the vascular anatomy. Carotid stenosis measurements (when applicable) are obtained utilizing NASCET criteria, using the distal internal carotid diameter as the denominator. CONTRAST:  75mL ISOVUE-370 IOPAMIDOL (ISOVUE-370) INJECTION 76% COMPARISON:  CT head earlier today. FINDINGS: There is significant motion degradation. Small or subtle abnormalities could be overlooked. CTA NECK FINDINGS Aortic arch: Standard branching. Imaged portion shows no evidence of aneurysm or dissection. No significant stenosis of the major arch vessel origins. Right carotid system: Patency is established.  Presence or absence of flow reducing lesion at the bifurcation difficult to confirm. Left carotid system: Patency is established. Presence or absence of a flow reducing lesion at the bifurcation difficult to confirm. Vertebral arteries: RIGHT vertebral is the dominant contributor. LEFT vertebral is diminutive. Assessment of ostial narrowing or disease in the neck is difficult to confirm. Skeleton: Spondylosis. Other neck: No gross mass lesion. Upper chest: No visible pneumothorax. Review of the MIP images confirms the above findings CTA HEAD FINDINGS Anterior circulation: No large vessel occlusion. The distal ICA, proximal ACA, and proximal MCA segments appear patent. Posterior circulation: No large vessel occlusion. RIGHT vertebral dominant contributor to the basilar. Venous sinuses: Grossly patent. Anatomic variants: Not established. Delayed phase: Not performed. Review of the MIP images confirms the above findings IMPRESSION: 1. Severely motion degraded examination. No extracranial or intracranial flow reducing lesion is observed. Patency of the extracranial and intracranial circulation is established. 2. These results were discussed directly at the time of interpretation on 10/05/2018 at 4:45 pm to Dr. Roland Rack , who verbally acknowledged these results. Electronically Signed   By: Staci Righter M.D.   On: 10/05/2018 17:04   Mr Jodene Nam Neck W Wo Contrast  Result Date: 10/06/2018 CLINICAL DATA:  Patient became confused after administration of high-dose steroids. EXAM: MRI HEAD WITHOUT CONTRAST MRA NECK WITHOUT AND WITH CONTRAST TECHNIQUE: Multiplanar, multiecho pulse sequences of the brain and surrounding structures were obtained without intravenous contrast. Angiographic images of the neck were obtained using MRA technique with and without intravenous contrast. Carotid stenosis measurements (when applicable) are obtained utilizing NASCET criteria, using the distal internal carotid diameter as the  denominator. CONTRAST:  Gadavist 10 mL. COMPARISON:  CT head without contrast, CT maxillofacial without contrast, CTA head neck with contrast performed 10/05/2018. FINDINGS: MRI HEAD FINDINGS Brain: No acute stroke, hemorrhage, mass lesion, hydrocephalus, or extra-axial fluid. Mild atrophy. Mild subcortical and periventricular T2 and FLAIR hyperintensities, likely chronic microvascular ischemic change. Prominent ventricles, likely central atrophy. Vascular: Patency of intracranial vasculature is established based on flow void. Skull and upper cervical spine: No acute findings. Sinuses/Orbits: Significant paranasal sinus disease, layering BILATERAL maxillary fluid, also mucosal thickening in the ethmoid, sphenoid,  and frontal sinuses. Negative orbits. Other: None. MRA NECK FINDINGS Conventional branching of the great vessels from the arch. No proximal stenosis. Carotid bifurcations are free of disease. There is moderate dolichoectasia of the cervical ICA segments. No evidence of carotid stenosis or fibromuscular dysplasia. No carotid dissection. RIGHT vertebral is dominant but both vertebrals are patent. Rudimentary connection of the LEFT vertebral to the basilar. RIGHT vertebral is widely patent throughout its vertebral segments and is the dominant contributor to the basilar. No dissection or ostial narrowing. IMPRESSION: MRI brain demonstrates no acute or focal abnormality. Specifically no evidence for acute stroke. Chronic and acute sinusitis. No extracranial stenosis or dissection.  Moderate dolichoectasia. Electronically Signed   By: Staci Righter M.D.   On: 10/06/2018 14:18   Mr Brain Wo Contrast (neuro Protocol)  Result Date: 10/06/2018 CLINICAL DATA:  Patient became confused after administration of high-dose steroids. EXAM: MRI HEAD WITHOUT CONTRAST MRA NECK WITHOUT AND WITH CONTRAST TECHNIQUE: Multiplanar, multiecho pulse sequences of the brain and surrounding structures were obtained without intravenous  contrast. Angiographic images of the neck were obtained using MRA technique with and without intravenous contrast. Carotid stenosis measurements (when applicable) are obtained utilizing NASCET criteria, using the distal internal carotid diameter as the denominator. CONTRAST:  Gadavist 10 mL. COMPARISON:  CT head without contrast, CT maxillofacial without contrast, CTA head neck with contrast performed 10/05/2018. FINDINGS: MRI HEAD FINDINGS Brain: No acute stroke, hemorrhage, mass lesion, hydrocephalus, or extra-axial fluid. Mild atrophy. Mild subcortical and periventricular T2 and FLAIR hyperintensities, likely chronic microvascular ischemic change. Prominent ventricles, likely central atrophy. Vascular: Patency of intracranial vasculature is established based on flow void. Skull and upper cervical spine: No acute findings. Sinuses/Orbits: Significant paranasal sinus disease, layering BILATERAL maxillary fluid, also mucosal thickening in the ethmoid, sphenoid, and frontal sinuses. Negative orbits. Other: None. MRA NECK FINDINGS Conventional branching of the great vessels from the arch. No proximal stenosis. Carotid bifurcations are free of disease. There is moderate dolichoectasia of the cervical ICA segments. No evidence of carotid stenosis or fibromuscular dysplasia. No carotid dissection. RIGHT vertebral is dominant but both vertebrals are patent. Rudimentary connection of the LEFT vertebral to the basilar. RIGHT vertebral is widely patent throughout its vertebral segments and is the dominant contributor to the basilar. No dissection or ostial narrowing. IMPRESSION: MRI brain demonstrates no acute or focal abnormality. Specifically no evidence for acute stroke. Chronic and acute sinusitis. No extracranial stenosis or dissection.  Moderate dolichoectasia. Electronically Signed   By: Staci Righter M.D.   On: 10/06/2018 14:18   Ct Head Code Stroke Wo Contrast  Result Date: 10/05/2018 CLINICAL DATA:  Code  stroke. Imbalance and confusion. Headache and facial pain. EXAM: CT HEAD WITHOUT CONTRAST TECHNIQUE: Contiguous axial images were obtained from the base of the skull through the vertex without intravenous contrast. COMPARISON:  Head CT 10/05/2018 at 1520 hours FINDINGS: Brain: No definite acute infarct, intracranial hemorrhage, mass, midline shift, or extra-axial fluid collection is identified within limitations of mild to slightly moderate motion artifact. Slight asymmetric hypoattenuation in the central portion of the right cerebellar hemispheres favored to be artifactual. Mild cerebral atrophy is again noted. Cerebral white matter hypodensities are nonspecific but compatible with mild chronic small vessel ischemic disease. Vascular: Mild calcified atherosclerosis at the skull base. No hyperdense vessel. Skull: No fracture or focal osseous lesion. Sinuses/Orbits: Prominent bilateral maxillary sinus fluid levels, unchanged. Mild bilateral ethmoid air cell mucosal thickening. Clear mastoid air cells. Unremarkable orbits. Other: None. ASPECTS [facility] Stroke Program Early  CT Score) Not scored with this history. IMPRESSION: 1. No evidence of acute intracranial abnormality within limitations of motion artifact. 2. Mild chronic small vessel ischemic disease and cerebral atrophy. These results were communicated to Dr. Leonel Ramsay at 4:24 pm on 10/05/2018 by text page via the [facility] messaging system. Electronically Signed   By: Logan Bores M.D.   On: 10/05/2018 16:24   Ct Maxillofacial Wo Contrast  Result Date: 10/05/2018 CLINICAL DATA:  Headache and left-sided facial pain for 1 week. EXAM: CT HEAD WITHOUT CONTRAST CT MAXILLOFACIAL WITHOUT CONTRAST TECHNIQUE: Multidetector CT imaging of the head and maxillofacial structures were performed using the standard protocol without intravenous contrast. Multiplanar CT image reconstructions of the maxillofacial structures were also generated. COMPARISON:  None. FINDINGS: CT  HEAD FINDINGS Brain: Mild diffuse cortical atrophy is noted. Mild chronic ischemic white matter disease is noted. No mass effect or midline shift is noted. Ventricular size is within normal limits. There is no evidence of mass lesion, hemorrhage or acute infarction. Vascular: No hyperdense vessel or unexpected calcification. Skull: Normal. Negative for fracture or focal lesion. Other: None. CT MAXILLOFACIAL FINDINGS Osseous: No fracture or mandibular dislocation. No destructive process. Orbits: Negative. No traumatic or inflammatory finding. Sinuses: Bilateral maxillary sinusitis is noted with air-fluid levels. Soft tissues: Negative. IMPRESSION: Mild diffuse cortical atrophy. Mild chronic ischemic white matter disease. No acute intracranial abnormality seen. Bilateral maxillary sinusitis is noted. Electronically Signed   By: Marijo Conception, M.D.   On: 10/05/2018 15:50   Dg Fluoro Guide Lumbar Puncture  Result Date: 10/06/2018 Margarette Canada, MD     10/06/2018  6:07 PM LP performed at L3-4 OP - 25 cm H2O 10 cc's clear colorless fluid obtained without complication       Scheduled Meds: .  stroke: mapping our early stages of recovery book   Does not apply Once  . insulin aspart  0-15 Units Subcutaneous TID WC  . [START ON 10/08/2018] pantoprazole  40 mg Oral Daily   Continuous Infusions: . sodium chloride    . acyclovir 755 mg (10/07/18 1318)  . cefTRIAXone (ROCEPHIN)  IV 2 g (10/07/18 0801)  . vancomycin 1,000 mg (10/07/18 0542)     LOS: 2 days    Time spent: 35 minutes    Edwin Dada, MD [facility] 10/07/2018, 1:54 PM     Please page through [place]:  [email or web address] Password [email or web address] If 7PM-7AM, please contact night-coverage

## 2018-10-07 NOTE — Procedures (Signed)
ELECTROENCEPHALOGRAM REPORT   Patient: [name]       Room #: 9J47W EEG No. ID: [id number] Age: 68 y.o.        Sex: male Referring Physician: Danford Report Date:  10/07/2018        Interpreting Physician: Alexis Goodell  History: [name] is an 68 y.o. male with expressive aphasia and mental status change  Medications:  Vancomycin, Rocephin, Protonix, Insulin, Zovirax  Conditions of Recording:  This is a 21 channel routine scalp EEG performed with bipolar and monopolar montages arranged in accordance to the international 10/20 system of electrode placement. One channel was dedicated to EKG recording.  The patient is in the lethargic and altered state.  Description:  The background activity for the majority of the recording resembles drowse.  The background is slow and consists mostly of a mixture of theta and delta activity that is diffusely distributed. Maximum posterior background rhythm is a 6-7 Hz theta activity.  The patient is stimulated and activation of the background rhythm is noted, particularly centrally where more theta activity is noted and there is some infrequent alpha seen.   No epileptiform activity is noted.   Hyperventilation and intermittent photic stimulation were not performed.   IMPRESSION: This EEG is characterized by slowing which is consistent with normal drowse.  Can not rule out the possibility of slowing related to general cerebral disturbance such as a metabolic encephalopathy.  Clinical correlation recommended.  No epileptiform activity is noted.     Alexis Goodell, MD Neurology [phone] 10/07/2018, 1:15 PM

## 2018-10-07 NOTE — Progress Notes (Signed)
Rehab Admissions Coordinator Note:  Per PT recommendation, this patient was screened by Jhonnie Garner for appropriateness for an Inpatient Acute Rehab Consult. Per PT eval, pt is Min G/Min A for transfers and ambulation without AD. Anticipate pt will progress quickly with acute therapies while in house. AC will follow along for additional therapy evaluations; if deficits persist, will request IP Rehab Consult Order.    Jhonnie Garner 10/07/2018, 3:16 PM  I can be reached at [phone].

## 2018-10-07 NOTE — Progress Notes (Signed)
PT Cancellation Note  Patient Details Name: [name] MRN: [id number] DOB: 09-Nov-1950   Cancelled Treatment:    Reason Eval/Treat Not Completed: Active bedrest orderContinues to have strict bedrest order. Will follow up as appropriate. Please update activity orders as appropriate for PT evaluation.   Marguarite Arbour A Anice Wilshire 10/07/2018, 7:04 AM Wray Kearns, PT, DPT Acute Rehabilitation Services Pager [phone] Office [phone]

## 2018-10-07 NOTE — Progress Notes (Signed)
SLP Cancellation Note  Patient Details Name: [name] MRN: [id number] DOB: 10-12-1950   Cancelled treatment:       Reason Eval/Treat Not Completed: Medical issues which prohibited therapy. Per RN, mentation continues to wax and wane. MRI negative for acute stroke, LP results pending. Will follow up.  Deneise Lever, [place], [place] Speech-Language Pathologist Acute Rehabilitation Services Pager: [phone] Office: [phone]    Aliene Altes 10/07/2018, 11:09 AM

## 2018-10-07 NOTE — Evaluation (Addendum)
Physical Therapy Evaluation Patient Details Name: [name] MRN: [id number] DOB: 03-17-1951 Today's Date: 10/07/2018   History of Present Illness  Patient is a 68 y/o male who presents with confusion, speech difficulties and balance deficits. Recently treated for sinus infection. LP puncture results pending. 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 MRI and head CT-unremarkable. Admitted with questionable bacterial meningitis vs. Viral meningitis/encephalitis. PMH includes HTN, HLD, DM, hearing loss.   Clinical Impression  Patient presents with generalized weakness, impaired balance, cognitive deficits and impaired mobility s/p above. Pt tolerated transfers and gait training with Min A for balance/safety. Pt with short shuffling like steps requiring HHA and rail for support. Impulsive at times. Difficult to assess cognition as pt masking with humor at times. 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 Has support from wife and children. Not close to functional/cognitive baseline at this time. Pt with poor awareness of safety/deficits. Will would benefit from rehab (pending improvement) to maximize independence and mobility prior to return home.    Follow Up Recommendations CIR    Equipment Recommendations  Other (comment)(TBA)    Recommendations for Other Services       Precautions / Restrictions Precautions Precautions: Fall Restrictions Weight Bearing Restrictions: No      Mobility  Bed Mobility Overal bed mobility: Modified Independent             General bed mobility comments: Impulsive to get to EOB. Use of rail.  Transfers Overall transfer level: Needs assistance Equipment used: None Transfers: Sit to/from Stand Sit to Stand: Min guard         General transfer comment: Min guard for safety. Stood from [employer].  Ambulation/Gait Ambulation/Gait assistance: Min assist Gait Distance (Feet): 125 Feet Assistive device: None(rail in hallway) Gait Pattern/deviations:  Step-through pattern;Decreased stride length;Shuffle;Decreased step length - right;Decreased step length - left Gait velocity: varying speeds   General Gait Details: Slow, unsteady gait with bil knee instability with some shuffling like gait, reaching for rail for support and HHA.   Stairs            Wheelchair Mobility    Modified Rankin (Stroke Patients Only) Modified Rankin (Stroke Patients Only) Pre-Morbid Rankin Score: No symptoms Modified Rankin: Moderately severe disability     Balance Overall balance assessment: Needs assistance Sitting-balance support: Feet supported;No upper extremity supported Sitting balance-Leahy Scale: Good Sitting balance - Comments: Able to donn socks sitting EOB with some difficulty.    Standing balance support: During functional activity Standing balance-Leahy Scale: Fair Standing balance comment: Able ot stand statically without UE support but requires assist for dynamic tasks. Able to stand and urinate with supervision                             Pertinent Vitals/Pain Pain Assessment: No/denies pain    Home Living Family/patient expects to be discharged to:: Private residence Living Arrangements: Spouse/significant other Available Help at Discharge: Family;Available PRN/intermittently Type of Home: House Home Access: Stairs to enter   [employer] of Steps: a few Home Layout: Two level;Able to live on main level with bedroom/bathroom Home Equipment: None Additional Comments: might have a RW in the attic    Prior Function Level of Independence: Independent         Comments: Drives. Works in [employer].      Hand Dominance        Extremity/Trunk Assessment   Upper Extremity Assessment Upper Extremity Assessment:  Defer to OT evaluation    Lower Extremity Assessment Lower Extremity Assessment: Generalized weakness       Communication   Communication: HOH  Cognition Arousal/Alertness:  Awake/alert Behavior During Therapy: Impulsive Overall Cognitive Status: Impaired/Different from baseline Area of Impairment: Memory;Safety/judgement;Awareness;Problem solving;Following commands                     Memory: Decreased short-term memory Following Commands: Follows one step commands with increased time;Follows multi-step commands inconsistently Safety/Judgement: Decreased awareness of safety;Decreased awareness of deficits Awareness: Intellectual Problem Solving: Requires verbal cues General Comments: A&Ox3, not sure why he is here. Difficult to get accurate cognition due to masking with humor at times.       General Comments General comments (skin integrity, edema, etc.): Daughter and son present during session.    Exercises     Assessment/Plan    PT Assessment Patient needs continued PT services  PT Problem List Decreased mobility;Decreased balance;Decreased cognition;Decreased safety awareness       PT Treatment Interventions Functional mobility training;Balance training;Patient/family education;Gait training;Therapeutic activities;Therapeutic exercise;Stair training;DME instruction;Cognitive remediation;Neuromuscular re-education    PT Goals (Current goals can be found in the Care Plan section)  Acute Rehab PT Goals Patient Stated Goal: to get out of this bed PT Goal Formulation: With patient Time For Goal Achievement: 10/21/18 Potential to Achieve Goals: Good    Frequency Min 3X/week   Barriers to discharge Decreased caregiver support      Co-evaluation               AM-PAC PT "6 Clicks" Mobility  Outcome Measure Help needed turning from your back to your side while in a flat bed without using bedrails?: A Little Help needed moving from lying on your back to sitting on the side of a flat bed without using bedrails?: A Little Help needed moving to and from a bed to a chair (including a wheelchair)?: A Little Help needed standing up from  a chair using your arms (e.g., wheelchair or bedside chair)?: A Little Help needed to walk in hospital room?: A Little Help needed climbing 3-5 steps with a railing? : A Little 6 Click Score: 18    End of Session Equipment Utilized During Treatment: Gait belt Activity Tolerance: Patient tolerated treatment well Patient left: in chair;with call bell/phone within reach;with family/visitor present(son present so chair alarm off) Nurse Communication: Mobility status PT Visit Diagnosis: Unsteadiness on feet (R26.81);Difficulty in walking, not elsewhere classified (R26.2)    Time: 3734-2876 PT Time Calculation (min) (ACUTE ONLY): 23 min   Charges:   PT Evaluation $PT Eval Moderate Complexity: 1 Mod PT Treatments $Gait Training: 8-22 mins        Wray Kearns, PT, DPT Acute Rehabilitation Services Pager [phone] Office [place] 10/07/2018, 1:58 PM

## 2018-10-07 NOTE — Progress Notes (Signed)
EEG completed, results pending. 

## 2018-10-08 ENCOUNTER — Inpatient Hospital Stay (HOSPITAL_COMMUNITY): Payer: 59

## 2018-10-08 DIAGNOSIS — G039 Meningitis, unspecified: Secondary | ICD-10-CM

## 2018-10-08 DIAGNOSIS — J0110 Acute frontal sinusitis, unspecified: Secondary | ICD-10-CM

## 2018-10-08 DIAGNOSIS — Z8739 Personal history of other diseases of the musculoskeletal system and connective tissue: Secondary | ICD-10-CM

## 2018-10-08 DIAGNOSIS — W228XXA Striking against or struck by other objects, initial encounter: Secondary | ICD-10-CM

## 2018-10-08 DIAGNOSIS — G009 Bacterial meningitis, unspecified: Secondary | ICD-10-CM

## 2018-10-08 DIAGNOSIS — H9209 Otalgia, unspecified ear: Secondary | ICD-10-CM

## 2018-10-08 DIAGNOSIS — S90512A Abrasion, left ankle, initial encounter: Secondary | ICD-10-CM

## 2018-10-08 LAB — GLUCOSE, CAPILLARY
GLUCOSE-CAPILLARY: 149 mg/dL — AB (ref 70–99)
GLUCOSE-CAPILLARY: 155 mg/dL — AB (ref 70–99)
Glucose-Capillary: 131 mg/dL — ABNORMAL HIGH (ref 70–99)
Glucose-Capillary: 114 mg/dL — ABNORMAL HIGH (ref 70–99)

## 2018-10-08 LAB — BASIC METABOLIC PANEL
Anion gap: 9 (ref 5–15)
BUN: 17 mg/dL (ref 8–23)
CO2: 24 mmol/L (ref 22–32)
CREATININE: 1.21 mg/dL (ref 0.61–1.24)
Calcium: 8.5 mg/dL — ABNORMAL LOW (ref 8.9–10.3)
Chloride: 102 mmol/L (ref 98–111)
GFR calc Af Amer: >60 mL/min (ref >60)
GFR calc non Af Amer: >60 mL/min (ref >60)
Glucose, Bld: 172 mg/dL — ABNORMAL HIGH (ref 70–99)
Potassium: 4.1 mmol/L (ref 3.5–5.1)
Sodium: 135 mmol/L (ref 135–145)

## 2018-10-08 LAB — CBC
HEMATOCRIT: 41.3 % (ref 39.0–52.0)
Hemoglobin: 14.3 g/dL (ref 13.0–17.0)
MCH: 30.2 pg (ref 26.0–34.0)
MCHC: 34.6 g/dL (ref 30.0–36.0)
MCV: 87.1 fL (ref 80.0–100.0)
Platelets: 265 10*3/uL (ref 150–400)
RBC: 4.74 MIL/uL (ref 4.22–5.81)
RDW: 12.7 % (ref 11.5–15.5)
WBC: 9.9 10*3/uL (ref 4.0–10.5)
nRBC: 0 % (ref 0.0–0.2)

## 2018-10-08 MED ORDER — DIAZEPAM 5 MG PO TABS
5.0000 mg | ORAL_TABLET | ORAL | Status: DC
  Filled 2018-10-08: qty 1

## 2018-10-08 NOTE — Progress Notes (Signed)
Physical Therapy Treatment Patient Details Name: [name] MRN: [id number] DOB: 1950-09-27 Today's Date: 10/08/2018    History of Present Illness Patient is a 68 y/o male who presents with confusion, speech difficulties and balance deficits. Recently treated for sinus infection. LP puncture results pending. 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 MRI and head CT-unremarkable. Admitted with questionable bacterial meningitis vs. Viral meningitis/encephalitis. PMH includes HTN, HLD, DM, hearing loss.     PT Comments    Pt with both cognitive and functional improvement however remains to do demo delayed processing, difficulty sequencing, fine motor deficits, shakiness, impaired co-ordination and increased falls risk due to impaired balance. 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 Pt with good home set up and support. Pt progressing well and may progress well enough throughout hospital stay to be able to d/c home with home health PT. PT to cont to work with patient and re-assess discharge recommendations.   Follow Up Recommendations  CIR;Supervision/Assistance - 24 hour (will con't to assess d/c recs as pt is progressing well)     Equipment Recommendations  Other (comment)(TBD)    Recommendations for Other Services       Precautions / Restrictions Precautions Precautions: Fall Restrictions Weight Bearing Restrictions: No    Mobility  Bed Mobility Overal bed mobility: Needs Assistance Bed Mobility: Supine to Sit     Supine to sit: Supervision;Min guard     General bed mobility comments: pt shaky and requiring max directional verbal cues to complete task, may be due to severe HOH, min guard for safety and impulsivity  Transfers Overall transfer level: Needs assistance Equipment used: None Transfers: Sit to/from Stand Sit to Stand: Min guard         General transfer comment: tactile cues to steady, pt mildly  steady  Ambulation/Gait Ambulation/Gait assistance: Min assist Gait Distance (Feet): 200 Feet Assistive device: None Gait Pattern/deviations: Step-through pattern;Decreased stride length Gait velocity: varying speeds   General Gait Details: initially reached for and held on to IV Pole due to instability, s/p amb of 50' pt let go of IV Pole and able to ambulate without AD. Pt more steady than yesterday however con't to be unsteady compared to baseline. Pt with shortened step height and length and requiring minA during turns and to navigate around obstacles. Pt was able to read room numbers and navigate back to room but followed wife   Stairs             Wheelchair Mobility    Modified Rankin (Stroke Patients Only) Modified Rankin (Stroke Patients Only) Pre-Morbid Rankin Score: No symptoms Modified Rankin: Moderate disability     Balance Overall balance assessment: Needs assistance Sitting-balance support: Feet supported;No upper extremity supported Sitting balance-Leahy Scale: Good Sitting balance - Comments: pt mildly unsteady and posterior lean with lower body co-ordination tasks   Standing balance support: During functional activity Standing balance-Leahy Scale: Fair Standing balance comment: Able to stand statically without UE support but requires assist for dynamic tasks. Able to stand and urinate with supervision                            Cognition Arousal/Alertness: Awake/alert Behavior During Therapy: Impulsive Overall Cognitive Status: Impaired/Different from baseline Area of Impairment: Memory;Following commands;Safety/judgement;Awareness;Problem solving                     Memory: Decreased short-term memory(remembers  ambulance ride but not ICU) Following Commands: Follows one step commands with increased time;Follows one step commands consistently Safety/Judgement: Decreased awareness of safety(beginning to ask questions about  coordination and shakiness) Awareness: Emergent Problem Solving: Slow processing;Difficulty sequencing;Requires verbal cues;Requires tactile cues General Comments: pt oriented to date, time, place, doesn't recall events after ambulance ride up until yesterday. Pt with increased ability to carry on conversation. pt is very HOH, put in hearing aides      Exercises      General Comments General comments (skin integrity, edema, etc.): Pt attempted to place hearing aide battery in hearing aide and was able to sequence and process however pt with impaired fine motor deficits and shakiness requiring assist to manage small battery      Pertinent Vitals/Pain Pain Assessment: 0-10 Pain Score: 3  Pain Location: head Pain Descriptors / Indicators: Headache Pain Intervention(s): Monitored during session    Home Living                      Prior Function            PT Goals (current goals can now be found in the care plan section) Acute Rehab PT Goals Patient Stated Goal: stop the headache Progress towards PT goals: Progressing toward goals    Frequency    Min 3X/week      PT Plan Current plan remains appropriate    Co-evaluation              AM-PAC PT "6 Clicks" Mobility   Outcome Measure  Help needed turning from your back to your side while in a flat bed without using bedrails?: A Little Help needed moving from lying on your back to sitting on the side of a flat bed without using bedrails?: A Little Help needed moving to and from a bed to a chair (including a wheelchair)?: A Little Help needed standing up from a chair using your arms (e.g., wheelchair or bedside chair)?: A Little Help needed to walk in hospital room?: A Little Help needed climbing 3-5 steps with a railing? : A Little 6 Click Score: 18    End of Session Equipment Utilized During Treatment: Gait belt Activity Tolerance: Patient tolerated treatment well Patient left: in chair;with call  bell/phone within reach;with chair alarm set;with family/visitor present Nurse Communication: Mobility status PT Visit Diagnosis: Unsteadiness on feet (R26.81);Difficulty in walking, not elsewhere classified (R26.2)     Time: 0730-0800 PT Time Calculation (min) (ACUTE ONLY): 30 min  Charges:  $Gait Training: 8-22 mins $Neuromuscular Re-education: 8-22 mins                     Kittie Plater, PT, DPT Acute Rehabilitation Services Pager #: [phone] Office #: [phone]    Berline Lopes 10/08/2018, 10:31 AM

## 2018-10-08 NOTE — Evaluation (Signed)
Occupational Therapy Evaluation Patient Details Name: [name] MRN: [id number] DOB: July 02, 1951 Today's Date: 10/08/2018    History of Present Illness Patient is a 68 y/o male who presents with confusion, speech difficulties and balance deficits. Recently treated for sinus infection. LP puncture results pending. 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 MRI and head CT-unremarkable. Admitted with questionable bacterial meningitis vs. Viral meningitis/encephalitis. PMH includes HTN, HLD, DM, hearing loss.    Clinical Impression   Patient presenting with decreased I in self care, balance, functional transfer/mobility, safety awareness, coordination, cognition, and endurance Patient reports being independent PTA. 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 Patient currently functioning Min A for higher level balance tasks. Decreased dexterity, speed, and coordination with L hand worse than R.  Pt with one LOB when exiting the bathroom and stepping backwards this session. Patient will benefit from acute OT to increase overall independence in the areas of ADLs, functional mobility, and safety awareness in order to safely discharge to next venue of care.    Follow Up Recommendations  CIR ( If pt progresses while on acute caseload he could go home with family,24/7 S, and HHOT)   Equipment Recommendations  Other (comment)(defer to next venue of care)    Recommendations for Other Services Other (comment)(none needed)     Precautions / Restrictions Precautions Precautions: Fall Restrictions Weight Bearing Restrictions: No      Mobility Bed Mobility   General bed mobility comments: seated in recliner chair upon entering the room  Transfers Overall transfer level: Needs assistance Equipment used: None Transfers: Sit to/from Stand Sit to Stand: Min guard         General transfer comment: tactile cues to steady    Balance Overall balance assessment: Needs assistance Sitting-balance support:  Feet supported;No upper extremity supported Sitting balance-Leahy Scale: Good     Standing balance support: During functional activity Standing balance-Leahy Scale: Fair Standing balance comment: 1 LOB requiring min A when backing up from bathroom with IV pole in hand        ADL either performed or assessed with clinical judgement   ADL Overall ADL's : Needs assistance/impaired Eating/Feeding: Set up;Sitting   Grooming: Wash/dry hands;Wash/dry face;Supervision/safety   Upper Body Bathing: Set up;Sitting   Lower Body Bathing: Minimal assistance;Sit to/from stand   Upper Body Dressing : Set up;Sitting   Lower Body Dressing: Minimal assistance;Sit to/from stand   Toilet Transfer: Designer, fashion/clothing and Hygiene: Min guard               Vision Baseline Vision/History: Wears glasses Wears Glasses: Reading only Patient Visual Report: No change from baseline              Pertinent Vitals/Pain Pain Assessment: No/denies pain Pain Score: 0-No pain     Hand Dominance Right   Extremity/Trunk Assessment Upper Extremity Assessment Upper Extremity Assessment: Generalized weakness   Lower Extremity Assessment Lower Extremity Assessment: Generalized weakness       Communication Communication Communication: HOH   Cognition Arousal/Alertness: Awake/alert Behavior During Therapy: Impulsive Overall Cognitive Status: Impaired/Different from baseline Area of Impairment: Memory;Following commands;Safety/judgement;Awareness;Problem solving          Memory: Decreased short-term memory Following Commands: Follows one step commands with increased time;Follows one step commands consistently Safety/Judgement: Decreased awareness of safety Awareness: Emergent Problem Solving: Slow processing;Difficulty sequencing;Requires verbal cues;Requires tactile cues General Comments: given BIMS. Able to immediate recall 3/3 but needing cuing to remember 3/3  later.  Home Living Family/patient expects to be discharged to:: Private residence Living Arrangements: Spouse/significant other Available Help at Discharge: Family;Available PRN/intermittently Type of Home: House Home Access: Stairs to enter [employer] of Steps: a few   Home Layout: Two level;Able to live on main level with bedroom/bathroom Alternate Level Stairs-Number of Steps: 1 flight   Bathroom Shower/Tub: Tub/shower unit         Home Equipment: None   Additional Comments: might have a RW in the attic      Prior Functioning/Environment Level of Independence: Independent        Comments: Drives. Works in [employer].         OT Problem List: Decreased strength;Impaired balance (sitting and/or standing);Decreased cognition;Pain;Decreased safety awareness;Decreased activity tolerance;Decreased coordination;Decreased knowledge of use of DME or AE      OT Treatment/Interventions: Self-care/ADL training;Manual therapy;Therapeutic exercise;Patient/family education;Balance training;Energy conservation;Therapeutic activities;DME and/or AE instruction;Neuromuscular education;Cognitive remediation/compensation    OT Goals(Current goals can be found in the care plan section) Acute Rehab OT Goals Patient Stated Goal: to get better OT Goal Formulation: With patient Time For Goal Achievement: 10/22/18 Potential to Achieve Goals: Good ADL Goals Pt Will Perform Grooming: with modified independence Pt Will Perform Upper Body Bathing: with modified independence Pt Will Perform Lower Body Bathing: with supervision Pt Will Perform Upper Body Dressing: with modified independence Pt Will Perform Lower Body Dressing: with supervision Pt Will Transfer to Toilet: with supervision;ambulating Pt Will Perform Toileting - Clothing Manipulation and hygiene: with supervision;sit to/from stand  OT Frequency: Min 2X/week   Barriers to D/C: Other  (comment)  none known at this time          AM-PAC OT "6 Clicks" Daily Activity     Outcome Measure Help from another person eating meals?: A Little Help from another person taking care of personal grooming?: A Little Help from another person toileting, which includes using toliet, bedpan, or urinal?: A Little Help from another person bathing (including washing, rinsing, drying)?: A Little Help from another person to put on and taking off regular upper body clothing?: A Little Help from another person to put on and taking off regular lower body clothing?: A Little 6 Click Score: 18   End of Session Equipment Utilized During Treatment: Other (comment)(none) Nurse Communication: Mobility status  Activity Tolerance: Patient tolerated treatment well Patient left: in chair;with call bell/phone within reach;with chair alarm set;with family/visitor present  OT Visit Diagnosis: Unsteadiness on feet (R26.81);Muscle weakness (generalized) (M62.81)                Time: 9702-6378 OT Time Calculation (min): 24 min Charges:  OT General Charges $OT Visit: 1 Visit OT Evaluation $OT Eval Low Complexity: 1 Low OT Treatments $Self Care/Home Management : 8-22 mins  Pershing Skidmore P, MS, OTR/L 10/08/2018, 3:52 PM

## 2018-10-08 NOTE — Progress Notes (Addendum)
Subjective: Neurology was re-consulted for assessment of possible partially treated meningitis.   Objective: Current vital signs: BP (!) 148/96 (BP Location: Left Arm)   Pulse 90   Temp (!) 97.5 F (36.4 C) (Oral)   Resp 20   Ht 5\' 11"  (1.803 m)   Wt 102.1 kg   SpO2 98%   BMI 31.39 kg/m  Vital signs in last 24 hours: Temp:  [97.5 F (36.4 C)-99 F (37.2 C)] 97.5 F (36.4 C) (02/03 0836) Pulse Rate:  [85-90] 90 (02/03 0836) Resp:  [16-20] 20 (02/03 0836) BP: (128-148)/(86-98) 148/96 (02/03 0836) SpO2:  [98 %] 98 % (02/03 0836)  Intake/Output from previous day: 02/02 0701 - 02/03 0700 In: 240 [P.O.:240] Out: 400 [Urine:400] Intake/Output this shift: No intake/output data recorded. Nutritional status:  Diet Order            Diet regular Room service appropriate? Yes; Fluid consistency: Thin  Diet effective now             HEENT: [place]/AT Lungs: Respirations unlabored Ext: No edema  Neurologic Exam: Mental Status: Alert, fully oriented, thought content appropriate.  Speech fluent with intact naming and repetition. The patient exhibits consistent difficulty with 3-step commands. Able to perform math calculations with ease. No dysarthria Cranial Nerves: II:  PERRL. Fixates normally III,IV, VI: EOMI without nystagmus.  V,VII: Decreased sensation to right side of face. No facial droop.  VIII: hearing intact to conversation IX,X: Palate symmetric XI: Symmetric XII: midline tongue extension  Motor: Right : Upper extremity   5/5    Left:     Upper extremity   5/5  Lower extremity   5/5     Lower extremity   5/5 Normal tone throughout; no atrophy noted Sensory: Decreased temp sensation to RUE, otherwise normal all 4 extremities. FT intact x 4.  Deep Tendon Reflexes:  2+ brachioradialis and patellae bilaterally Cerebellar: No ataxia with FNF. Action tremor is present bilaterally. No asterixis.  Gait: Deferred  Lab Results: Results for orders placed or performed during  the hospital encounter of 10/05/18 (from the past 48 hour(s))  Sedimentation rate     Status: None   Collection Time: 10/06/18  2:39 PM  Result Value Ref Range   Sed Rate 4 0 - 16 mm/hr    Comment: Performed at [facility], [place] [address] [place], [place] [address]  C-reactive protein     Status: None   Collection Time: 10/06/18  2:39 PM  Result Value Ref Range   CRP <0.8 <1.0 mg/dL    Comment: Performed at [facility], [place] [address], [place], [place] [address]  Glucose, CSF     Status: None   Collection Time: 10/06/18  6:16 PM  Result Value Ref Range   Glucose, CSF 68 40 - 70 mg/dL    Comment: Performed at [facility] [address] [place], [place] [address]  Protein, CSF     Status: Abnormal   Collection Time: 10/06/18  6:16 PM  Result Value Ref Range   Total  Protein, CSF 72 (H) 15 - 45 mg/dL    Comment: Performed at [facility] [address], [place], [place] [address]  CSF cell count with differential     Status: Abnormal   Collection Time: 10/06/18  6:16 PM  Result Value Ref Range   Tube # 3    Color, CSF COLORLESS COLORLESS   Appearance, CSF CLEAR (A) CLEAR   Supernatant  NOT INDICATED    RBC Count, CSF 0 0 /cu mm   WBC, CSF 51 (HH) 0 - 5 /cu mm    Comment: CRITICAL RESULT CALLED TO, READ BACK BY AND VERIFIED WITH: J CARMICHAEL,RN AT 1912 10/06/2018 BY L BENFIELD    Segmented Neutrophils-CSF 0 0 - 6 %   Lymphs, CSF 93 (H) 40 - 80 %   Monocyte-Macrophage-Spinal Fluid 6 (L) 15 - 45 %   Eosinophils, CSF 0 0 - 1 %   Other Cells, CSF 1     Comment: Performed at [facility] [address] [place], [place] [address]  CSF culture     Status: None (Preliminary result)   Collection Time: 10/06/18  6:16 PM  Result Value Ref Range   Specimen Description CSF    Special Requests TUBE 2    Gram Stain      WBC PRESENT, PREDOMINANTLY MONONUCLEAR NO ORGANISMS SEEN CYTOSPIN SMEAR    Culture      NO GROWTH 2 DAYS Performed at [facility], [place] [address], [place], [place] [address]    Report Status PENDING   Glucose, capillary     Status: Abnormal   Collection Time: 10/07/18  6:13 AM  Result Value Ref Range   Glucose-Capillary 148 (H) 70 - 99 mg/dL   Comment 1 Notify RN   Glucose, capillary     Status: Abnormal   Collection Time: 10/07/18 12:36 PM  Result Value Ref Range   Glucose-Capillary 111 (H) 70 - 99 mg/dL  Glucose, capillary     Status: Abnormal   Collection Time: 10/07/18  4:13 PM  Result Value Ref Range   Glucose-Capillary 135 (H) 70 - 99 mg/dL  Glucose, capillary     Status: Abnormal   Collection Time: 10/07/18 10:04 PM  Result Value Ref Range   Glucose-Capillary 138 (H) 70 - 99 mg/dL  Glucose, capillary     Status: Abnormal   Collection Time: 10/08/18  6:08 AM  Result Value Ref Range   Glucose-Capillary 131 (H) 70 - 99 mg/dL  Glucose, capillary     Status: Abnormal   Collection Time: 10/08/18  6:32 AM  Result Value Ref Range   Glucose-Capillary 149 (H) 70 - 99 mg/dL  CBC     Status: None   Collection Time: 10/08/18  7:32 AM  Result Value Ref Range   WBC 9.9 4.0 - 10.5 K/uL   RBC 4.74 4.22 - 5.81 MIL/uL   Hemoglobin 14.3 13.0 - 17.0 g/dL   HCT 41.3 39.0 - 52.0 %   MCV 87.1 80.0 - 100.0 fL   MCH 30.2 26.0 - 34.0 pg   MCHC 34.6 30.0 - 36.0 g/dL   RDW 12.7 11.5 - 15.5 %   Platelets 265 150 - 400 K/uL   nRBC 0.0 0.0 - 0.2 %    Comment: Performed at [facility], [place] [address] [place], [place] [address]  Basic metabolic panel     Status: Abnormal   Collection Time: 10/08/18  7:32 AM  Result Value Ref Range   Sodium 135 135 - 145 mmol/L   Potassium 4.1 3.5 - 5.1 mmol/L   Chloride 102 98 - 111 mmol/L   CO2 24 22 - 32 mmol/L   Glucose, Bld 172 (H) 70 - 99 mg/dL   BUN 17 8 - 23 mg/dL   Creatinine, Ser 1.21 0.61 - 1.24 mg/dL   Calcium 8.5 (L) 8.9 - 10.3 mg/dL   GFR  calc non Af Amer >60 >60 mL/min   GFR calc Af Amer >60 >60 mL/min   Anion gap 9 5 - 15    Comment: Performed at  [facility] [address] [place], [place] [address]  Glucose, capillary     Status: Abnormal   Collection Time: 10/08/18 11:36 AM  Result Value Ref Range   Glucose-Capillary 155 (H) 70 - 99 mg/dL   Comment 1 Notify RN    Comment 2 Document in Chart     Recent Results (from the past 240 hour(s))  Blood culture (routine x 2)     Status: None (Preliminary result)   Collection Time: 10/05/18  2:30 PM  Result Value Ref Range Status   Specimen Description BLOOD LEFT ANTECUBITAL  Final   Special Requests   Final    BOTTLES DRAWN AEROBIC AND ANAEROBIC Blood Culture results may not be optimal due to an inadequate volume of blood received in culture bottles   Culture   Final    NO GROWTH 3 DAYS Performed at [facility] [address], [place], [place] [address]    Report Status PENDING  Incomplete  Blood culture (routine x 2)     Status: None (Preliminary result)   Collection Time: 10/05/18  3:05 PM  Result Value Ref Range Status   Specimen Description BLOOD RIGHT HAND  Final   Special Requests   Final    BOTTLES DRAWN AEROBIC ONLY Blood Culture adequate volume   Culture   Final    NO GROWTH 3 DAYS Performed at [facility], [place] [address] [place], [place] [address]    Report Status PENDING  Incomplete  Urine culture     Status: None   Collection Time: 10/05/18  3:10 PM  Result Value Ref Range Status   Specimen Description URINE, CLEAN CATCH  Final   Special Requests NONE  Final   Culture   Final    NO GROWTH Performed at [facility], [place] [address], [place], [place] [address]    Report Status 10/06/2018 FINAL  Final  CSF culture     Status: None (Preliminary result)   Collection Time: 10/06/18  6:16 PM  Result Value Ref Range Status   Specimen Description CSF  Final   Special Requests TUBE 2  Final   Gram Stain   Final    WBC PRESENT, PREDOMINANTLY MONONUCLEAR NO ORGANISMS SEEN CYTOSPIN SMEAR    Culture   Final    NO GROWTH 2  DAYS Performed at [facility], [place] [address], [place], [place] [address]    Report Status PENDING  Incomplete    Lipid Panel Recent Labs    10/06/18 0520  CHOL 101  TRIG 79  HDL 30*  CHOLHDL 3.4  VLDL 16  LDLCALC 55    Studies/Results: Mr Jodene Nam Neck W Wo Contrast  Result Date: 10/06/2018 CLINICAL DATA:  Patient became confused after administration of high-dose steroids. EXAM: MRI HEAD WITHOUT CONTRAST MRA NECK WITHOUT AND WITH CONTRAST TECHNIQUE: Multiplanar, multiecho pulse sequences of the brain and surrounding structures were obtained without intravenous contrast. Angiographic images of the neck were obtained using MRA technique with and without intravenous contrast. Carotid stenosis measurements (when applicable) are obtained utilizing NASCET criteria, using the distal internal carotid diameter as the denominator. CONTRAST:  Gadavist 10 mL. COMPARISON:  CT head without contrast, CT maxillofacial without contrast, CTA head neck with contrast performed 10/05/2018. FINDINGS: MRI HEAD FINDINGS Brain:  No acute stroke, hemorrhage, mass lesion, hydrocephalus, or extra-axial fluid. Mild atrophy. Mild subcortical and periventricular T2 and FLAIR hyperintensities, likely chronic microvascular ischemic change. Prominent ventricles, likely central atrophy. Vascular: Patency of intracranial vasculature is established based on flow void. Skull and upper cervical spine: No acute findings. Sinuses/Orbits: Significant paranasal sinus disease, layering BILATERAL maxillary fluid, also mucosal thickening in the ethmoid, sphenoid, and frontal sinuses. Negative orbits. Other: None. MRA NECK FINDINGS Conventional branching of the great vessels from the arch. No proximal stenosis. Carotid bifurcations are free of disease. There is moderate dolichoectasia of the cervical ICA segments. No evidence of carotid stenosis or fibromuscular dysplasia. No carotid dissection. RIGHT vertebral is dominant but both  vertebrals are patent. Rudimentary connection of the LEFT vertebral to the basilar. RIGHT vertebral is widely patent throughout its vertebral segments and is the dominant contributor to the basilar. No dissection or ostial narrowing. IMPRESSION: MRI brain demonstrates no acute or focal abnormality. Specifically no evidence for acute stroke. Chronic and acute sinusitis. No extracranial stenosis or dissection.  Moderate dolichoectasia. Electronically Signed   By: Staci Righter M.D.   On: 10/06/2018 14:18   Mr Brain Wo Contrast (neuro Protocol)  Result Date: 10/06/2018 CLINICAL DATA:  Patient became confused after administration of high-dose steroids. EXAM: MRI HEAD WITHOUT CONTRAST MRA NECK WITHOUT AND WITH CONTRAST TECHNIQUE: Multiplanar, multiecho pulse sequences of the brain and surrounding structures were obtained without intravenous contrast. Angiographic images of the neck were obtained using MRA technique with and without intravenous contrast. Carotid stenosis measurements (when applicable) are obtained utilizing NASCET criteria, using the distal internal carotid diameter as the denominator. CONTRAST:  Gadavist 10 mL. COMPARISON:  CT head without contrast, CT maxillofacial without contrast, CTA head neck with contrast performed 10/05/2018. FINDINGS: MRI HEAD FINDINGS Brain: No acute stroke, hemorrhage, mass lesion, hydrocephalus, or extra-axial fluid. Mild atrophy. Mild subcortical and periventricular T2 and FLAIR hyperintensities, likely chronic microvascular ischemic change. Prominent ventricles, likely central atrophy. Vascular: Patency of intracranial vasculature is established based on flow void. Skull and upper cervical spine: No acute findings. Sinuses/Orbits: Significant paranasal sinus disease, layering BILATERAL maxillary fluid, also mucosal thickening in the ethmoid, sphenoid, and frontal sinuses. Negative orbits. Other: None. MRA NECK FINDINGS Conventional branching of the great vessels from the  arch. No proximal stenosis. Carotid bifurcations are free of disease. There is moderate dolichoectasia of the cervical ICA segments. No evidence of carotid stenosis or fibromuscular dysplasia. No carotid dissection. RIGHT vertebral is dominant but both vertebrals are patent. Rudimentary connection of the LEFT vertebral to the basilar. RIGHT vertebral is widely patent throughout its vertebral segments and is the dominant contributor to the basilar. No dissection or ostial narrowing. IMPRESSION: MRI brain demonstrates no acute or focal abnormality. Specifically no evidence for acute stroke. Chronic and acute sinusitis. No extracranial stenosis or dissection.  Moderate dolichoectasia. Electronically Signed   By: Staci Righter M.D.   On: 10/06/2018 14:18   Dg Fluoro Guide Lumbar Puncture  Result Date: 10/06/2018 Margarette Canada, MD     10/06/2018  6:07 PM LP performed at L3-4 OP - 25 cm H2O 10 cc's clear colorless fluid obtained without complication   Medications:  Prior to Admission:  Medications Prior to Admission  Medication Sig Dispense Refill Last Dose  . acetaminophen (TYLENOL) 500 MG tablet Take 1,000 mg by mouth 2 (two) times daily as needed for mild pain.   10/04/2018 at Unknown time  . azelastine (ASTELIN) 0.1 % nasal spray Place 1 spray into both nostrils  2 (two) times daily. Use in each nostril as directed   10/04/2018 at Unknown time  . cholecalciferol (VITAMIN D) 1000 UNITS tablet Take 1,000 Units by mouth daily.     10/05/2018 at Unknown time  . diazepam (VALIUM) 5 MG tablet Take 5 mg by mouth at bedtime as needed for sleep.   unk  . doxycycline (VIBRAMYCIN) 100 MG capsule Take 100 mg by mouth 2 (two) times daily. For 10 days   10/05/2018 at Unknown time  . FLUoxetine (PROZAC) 10 MG capsule Take 1 capsule by mouth daily.   10/04/2018 at Unknown time  . fluticasone (FLONASE) 50 MCG/ACT nasal spray Place 1 spray into both nostrils daily.   10/05/2018 at Unknown time  . HYDROcodone-homatropine (HYCODAN)  5-1.5 MG/5ML syrup Take 5 mLs by mouth every 6 (six) hours as needed for cough.   10/04/2018 at Unknown time  . ibuprofen (ADVIL,MOTRIN) 800 MG tablet Take 800 mg by mouth 3 (three) times daily as needed for moderate pain.   10/04/2018 at Unknown time  . metFORMIN (GLUCOPHAGE) 500 MG tablet Take 1 tablet by mouth daily.    10/05/2018 at Unknown time  . omeprazole (PRILOSEC) 20 MG capsule Take 20 mg by mouth daily.   10/05/2018 at Unknown time  . rosuvastatin (CRESTOR) 10 MG tablet Take 10 mg by mouth daily.   10/05/2018 at Unknown time  . S-Adenosylmethionine (SAM-E) 400 MG TABS Take 1 tablet by mouth daily.     10/05/2018 at Unknown time   Scheduled: . diazepam  5 mg Oral UD  . insulin aspart  0-15 Units Subcutaneous TID WC  . pantoprazole  40 mg Oral Daily   Continuous: . acyclovir 755 mg (10/08/18 0540)  . cefTRIAXone (ROCEPHIN)  IV 2 g (10/08/18 0819)  . vancomycin 1,000 mg (10/08/18 0701)     Assessment: 1. On examination, the patient exhibits consistent difficulty with 3-step commands consistent with mild frontal lobe dysfunction. DDx includes mild cognitive impairment due to an incipient dementing process or partially treated meningitis. 2. LP was most consistent with a partially treated meningitis 3. Stroke work up was negative. My overall impression is that the patient's presentation was due to a meningitis rather than a TIA.   Recommendations; 1. Continue antibiotics for a full course of meningitic coverage.  2. Outpatient Neurology follow up for possible incipient dementia.    LOS: 3 days   @Electronically  signed: Dr. Kerney Elbe 10/08/2018  12:08 PM

## 2018-10-08 NOTE — Consult Note (Signed)
Date of Admission:  10/05/2018          Reason for Consult:   Meningitis Referring Provider: Dr. Eudelia Bunch   Assessment:  1. Partially to treated bacterial meningitis seeded from 2. Possible otitis media, sinusitis 3. Rule out cavernous vein thrombosis 4. History of septic bursitis of left elbow with still residual erythema 5.   Plan:  1. DC vancomycin 2. Check VZV on CSF though zoster would seem very unlikely 3. Follow-up HSV PCR 4. MR venogram ordered Reffner with antecedent diazepam 5 mg once and another 5 mg if needed 5. To do more aggressive imaging of ears and sinuses potential CT maxillofacial  Principal Problem:   Delirium Active Problems:   DM (diabetes mellitus), type 2 with renal complications (HCC)   Hyperlipidemia   Sinusitis   Scheduled Meds: . diazepam  5 mg Oral UD  . insulin aspart  0-15 Units Subcutaneous TID WC  . pantoprazole  40 mg Oral Daily   Continuous Infusions: . acyclovir 755 mg (10/08/18 1338)  . cefTRIAXone (ROCEPHIN)  IV 2 g (10/08/18 0819)   PRN Meds:.acetaminophen **OR** acetaminophen (TYLENOL) oral liquid 160 mg/5 mL **OR** acetaminophen, HYDROcodone-acetaminophen  HPI: GABE GLACE is a 68 y.o. male who had developed severe ear pain and was worked up at [employer] where his daughter practices as a Librarian, academic.  He was given diagnosis of sinus tract infection and prescribed Augmentin which he takes for approximately 5 days.  He was still not having resolution of his sinus pain and did have also headache that was fairly severe.  He was then changed over to doxycycline.  This then coincided with worsening of his ear pain.  He was then and underwent intramuscular dexamethasone shot.  He then became profoundly confused and developed aphasia along with worsening ear pain agitation combativeness and disorientation.  He was initially seen in the ER and given TPA due to concern for an acute stroke.  He was admitted to the neurology service.   MRI of the brain without contrast was unrevealing.  Concern for meningitis and he underwent lumbar puncture which showed greater than 50 white blood cells with predominant lymphocytes goes of 68 and a protein of 72.  Heart on ceftriaxone vancomycin and acyclovir.  His cognition is improved since admission.  He is quite hard of hearing but seems lucid knowing the exact date when I talked to him and that he was in the hospital.  He also recall much of his prior medical history with problems with his elbow for example.    Review of Systems: Review of Systems  Constitutional: Negative for chills, diaphoresis, fever, malaise/fatigue and weight loss.  HENT: Positive for ear pain. Negative for congestion, hearing loss, sore throat and tinnitus.   Eyes: Negative for blurred vision and double vision.  Respiratory: Negative for cough, sputum production, shortness of breath and wheezing.   Cardiovascular: Negative for chest pain, palpitations and leg swelling.  Gastrointestinal: Negative for abdominal pain, blood in stool, constipation, diarrhea, heartburn, melena, nausea and vomiting.  Genitourinary: Negative for dysuria, flank pain and hematuria.  Musculoskeletal: Negative for back pain, falls, joint pain and myalgias.  Skin: Negative for itching and rash.  Neurological: Positive for dizziness, speech change, weakness and headaches. Negative for sensory change, focal weakness and loss of consciousness.  Endo/Heme/Allergies: Does not bruise/bleed easily.  Psychiatric/Behavioral: Negative for depression, memory loss and suicidal ideas. The patient is not nervous/anxious.     Past Medical History:  Diagnosis  Date  . Decreased hearing   . Diabetes mellitus without complication ([place])   . History of adenomatous polyp of colon   . Hyperlipidemia   . Hypertension   . Lumbar spondylosis   . Obesity   . Vitamin D deficiency     Social History   Tobacco Use  . Smoking status: Never Smoker  .  Smokeless tobacco: Never Used  Substance Use Topics  . Alcohol use: Yes    Comment: 2 to 3 x per week.  . Drug use: No    Family History  Problem Relation Age of Onset  . Heart failure Mother   . Hypertension Mother   . Hypertension Father   . Breast cancer Sister   . Prostate cancer Brother   . Heart attack Brother 55  . Heart failure Maternal Grandmother   . Heart failure Maternal Grandfather   . Heart failure Paternal Grandmother   . Heart failure Paternal Grandfather    No Known Allergies  OBJECTIVE: Blood pressure (!) 129/97, pulse 84, temperature 97.6 F (36.4 C), temperature source Oral, resp. rate 17, height 5\' 11"  (1.803 m), weight 102.1 kg, SpO2 99 %.  Physical Exam Constitutional:      General: He is not in acute distress.    Appearance: Normal appearance. He is well-developed. He is not ill-appearing or diaphoretic.  HENT:     Head: Normocephalic and atraumatic.     Right Ear: Hearing and external ear normal.     Left Ear: Hearing and external ear normal.     Nose: No nasal deformity or rhinorrhea.     Mouth/Throat:     Mouth: Mucous membranes are moist.  Eyes:     General: No scleral icterus.    Extraocular Movements: Extraocular movements intact.     Conjunctiva/sclera: Conjunctivae normal.     Right eye: Right conjunctiva is not injected.     Left eye: Left conjunctiva is not injected.  Neck:     Musculoskeletal: Normal range of motion and neck supple.     Vascular: No JVD.  Cardiovascular:     Rate and Rhythm: Normal rate and regular rhythm.     Heart sounds: Normal heart sounds, S1 normal and S2 normal. No murmur. No friction rub.  Pulmonary:     Effort: Pulmonary effort is normal. No respiratory distress.     Breath sounds: Normal breath sounds. No stridor. No rhonchi or rales.  Abdominal:     General: Bowel sounds are normal. There is no distension.     Palpations: Abdomen is soft.     Tenderness: There is no abdominal tenderness.    Musculoskeletal: Normal range of motion.     Right shoulder: Normal.     Left shoulder: Normal.     Left elbow: He exhibits normal range of motion, no swelling and no effusion. No tenderness found.     Right hip: Normal.     Left hip: Normal.     Right knee: Normal.     Left knee: Normal.     Comments: Some erythema overlying the elbow but is not painful or hot   Lymphadenopathy:     Head:     Right side of head: No submandibular, preauricular or posterior auricular adenopathy.     Left side of head: No submandibular, preauricular or posterior auricular adenopathy.     Cervical: No cervical adenopathy.     Right cervical: No superficial or deep cervical adenopathy.    Left cervical: No  superficial or deep cervical adenopathy.  Skin:    General: Skin is warm and dry.     Coloration: Skin is not pale.     Findings: No abrasion, bruising, ecchymosis, erythema, lesion or rash.     Nails: There is no clubbing.   Neurological:     General: No focal deficit present.     Mental Status: He is alert and oriented to person, place, and time.     Sensory: No sensory deficit.     Coordination: Coordination normal.     Gait: Gait normal.  Psychiatric:        Attention and Perception: He is attentive.        Mood and Affect: Mood normal.        Speech: Speech normal.        Behavior: Behavior normal. Behavior is cooperative.        Thought Content: Thought content normal.        Judgment: Judgment normal.   He has an abrasion over his left ankle where apparently he had been combative in the MRI machine where he is very claustrophobic  Lab Results Lab Results  Component Value Date   WBC 9.9 10/08/2018   HGB 14.3 10/08/2018   HCT 41.3 10/08/2018   MCV 87.1 10/08/2018   PLT 265 10/08/2018    Lab Results  Component Value Date   CREATININE 1.21 10/08/2018   BUN 17 10/08/2018   NA 135 10/08/2018   K 4.1 10/08/2018   CL 102 10/08/2018   CO2 24 10/08/2018    Lab Results  Component  Value Date   ALT 23 10/05/2018   AST 21 10/05/2018   ALKPHOS 74 10/05/2018   BILITOT 0.6 10/05/2018     Microbiology: Recent Results (from the past 240 hour(s))  Blood culture (routine x 2)     Status: None (Preliminary result)   Collection Time: 10/05/18  2:30 PM  Result Value Ref Range Status   Specimen Description BLOOD LEFT ANTECUBITAL  Final   Special Requests   Final    BOTTLES DRAWN AEROBIC AND ANAEROBIC Blood Culture results may not be optimal due to an inadequate volume of blood received in culture bottles   Culture   Final    NO GROWTH 3 DAYS Performed at [facility], [place] [address], [place], [place] [address]    Report Status PENDING  Incomplete  Blood culture (routine x 2)     Status: None (Preliminary result)   Collection Time: 10/05/18  3:05 PM  Result Value Ref Range Status   Specimen Description BLOOD RIGHT HAND  Final   Special Requests   Final    BOTTLES DRAWN AEROBIC ONLY Blood Culture adequate volume   Culture   Final    NO GROWTH 3 DAYS Performed at [facility], [place] [address] [place], [place] [address]    Report Status PENDING  Incomplete  Urine culture     Status: None   Collection Time: 10/05/18  3:10 PM  Result Value Ref Range Status   Specimen Description URINE, CLEAN CATCH  Final   Special Requests NONE  Final   Culture   Final    NO GROWTH Performed at [facility], [place] [address] [place], [place] [address]    Report Status 10/06/2018 FINAL  Final  CSF culture     Status: None (Preliminary result)   Collection Time: 10/06/18  6:16 PM  Result Value Ref Range Status  Specimen Description CSF  Final   Special Requests TUBE 2  Final   Gram Stain   Final    WBC PRESENT, PREDOMINANTLY MONONUCLEAR NO ORGANISMS SEEN CYTOSPIN SMEAR    Culture   Final    NO GROWTH 2 DAYS Performed at [facility], [address] [place], [place] [address]    Report Status PENDING  Incomplete    Alcide Evener, [place] [facility] [phone] pager  10/08/2018, 2:13 PM

## 2018-10-08 NOTE — Progress Notes (Signed)
PROGRESS NOTE    [name]  FYB:[id number] DOB: 1950/11/27 DOA: 10/05/2018 PCP: Brian Rim, MD      Brief Narrative:  Mr. [name] is a 68 y.o. M with hx NIDDM, hyperlipidemia who presents with sinusitis followed by severe ear pain and confusion and brief episodes of aphasia.  Patient was in [place] until 1 week ago, developed sinus pressure and nasal congestion.  Treated with Augmentin, but developed unilateral ear pain, so severe he was crying in pain.  Changed to doxycycline, given IM steroid shot, and then developed confusion, disorientation.  Sent to ER where he initially appeared to clear mentally, but then had recurrence of aphasia, shooting ear pain and marked agitation, combativeness, disorientation.  Given concern for stuttering stroke, tPA was administered and patient was admitted to Neurology service.       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  MRI negative.    At this point, diagnostic explanations for his aphasia and disorientation include stuttering MRI negative stroke or more likely metabolic encephalopathy/delirium in the setting of IM steroids, opiates and aseptic meningitis.  No encephalitis on MRI brain. MRA showed no dissection. Less likely seizures, EEG showed no epileptiform activity.  Carotid imaging with MRA showed no stenosis.  Echocardiogram showed no cardiogenic source of emboli.  Hemoglobin A1c well controlled. -PT eval -Continue aspirin, Crestor -Appreciate neurology expertise, will re-consult for follow up of aphasia/encephalopathy, now in light of clinical course, CSF findings  Delirium precautions:   -Lights and TV off, minimize interruptions at night  -Blinds open and lights on during day  -Glasses/hearing aid with patient  -Frequent reorientation  -PT/OT when able  -Avoid sedation medications/Beers list medications        Viral meningitis vs partially treated meningitis LP obtained and  showed >50 WBCs, lymphocytosis in the tube 3, without red cells at all.  Protein slightly up, glucose normal.    -Follow CSF culture -Continue IV antibiotics -Consult ID for opinion re: to continue treatment of bacterial ie. Partially treated meningitis or discontinue antibiotics  -Follow HSV PCR   Sinusitis Covered by above antibiotics.  Type 2 diabetes, uncomplicated, non-insulin dependent A1c well controlled -Continue SSI corrections   -Hold home metformin  Other medications -Hold home fluoxetine and benzodiazepines        MDM and disposition: The below labs and imaging reports were reviewed and summarized above.  Medication management as above.  The case was discussed with neurology and infectious disease.  The patient was admitted with encephalopathy, and initially concern for stroke.  TPA was given.  Follow-up MRI is been negative, and it appears more likely that the patient has encephalopathy/likely delirium in the setting of early dementia, and a viral or partially treated bacterial meningitis.  Appreciate infectious disease neurology input.       DVT prophylaxis: SCDs Code Status: FULL Family Communication: Son at the bedside   Consultants:   Neurology  Infectious disease  Procedures:   EEG  MRI brain  MRA head and neck  CTA head and neck  Echocardiogram  MRV head   Antimicrobials:  Anti-infectives (From admission, onward)   Start     Dose/Rate Route Frequency Ordered Stop   10/07/18 0600  vancomycin (VANCOCIN) IVPB 1000 mg/200 mL premix  Status:  Discontinued     1,000 mg 200 mL/hr over 60 Minutes Intravenous Every 8 hours 10/06/18 2010 10/08/18 1413   10/06/18 2200  acyclovir (ZOVIRAX) 755 mg in dextrose 5 % 150  mL IVPB     10 mg/kg  75.3 kg (Ideal) 165.1 mL/hr over 60 Minutes Intravenous Every 8 hours 10/06/18 2010     10/06/18 2015  cefTRIAXone (ROCEPHIN) 2 g in sodium chloride 0.9 % 100 mL IVPB     2 g 200 mL/hr over 30 Minutes  Intravenous Every 12 hours 10/06/18 2002     10/06/18 2015  vancomycin (VANCOCIN) 2,000 mg in sodium chloride 0.9 % 500 mL IVPB     2,000 mg 250 mL/hr over 120 Minutes Intravenous  Once 10/06/18 2010 10/06/18 2308   10/06/18 1000  doxycycline (VIBRA-TABS) tablet 100 mg  Status:  Discontinued    Note to Pharmacy:  For 10 days     100 mg Oral 2 times daily 10/06/18 6222 10/06/18 1812       Subjective: Patient sitting up in a chair, he seems to have some psychomotor slowing, but feels much better.  Still some moderate headache.  No vomiting, visual changes, focal weakness, focal numbness, slurred speech, syncope, seizure.  No neck pain.  No fever.         Objective: Vitals:   10/08/18 0030 10/08/18 0433 10/08/18 0836 10/08/18 1246  BP: (!) 144/89 (!) 130/98 (!) 148/96 (!) 129/97  Pulse: 85 89 90 84  Resp: 16 18 20 17   Temp: 98.7 F (37.1 C) 98.4 F (36.9 C) (!) 97.5 F (36.4 C) 97.6 F (36.4 C)  TempSrc: Oral Oral Oral Oral  SpO2: 98% 98% 98% 99%  Weight:      Height:        Intake/Output Summary (Last 24 hours) at 10/08/2018 1517 Last data filed at 10/08/2018 0056 Gross per 24 hour  Intake 240 ml  Output 400 ml  Net -160 ml   Filed Weights   10/05/18 1600 10/05/18 1800  Weight: 106.2 kg 102.1 kg    Examination: General appearance:   Adult male, sitting in recliner, no acute distress, interactive HEENT: Anicteric, conjunctival pink, lids and lashes normal.  No nasal deformity, discharge, epistaxis.  Bilateral tympanic membranes are reflective, without serous or purulent effusion.  Oropharynx moist without oral lesions.  Hearing normal.  Dentition normal, lips normal. Skin: W skin is warm and dry, no suspicious rashes or lesions. Cardiac: The rate and rhythm, no murmurs appreciated, cap refill brisk Respiratory: Normal respiratory rate and rhythm, lungs clear without rales or wheezes. Abdomen: Abdomen soft without tenderness to palpation, ascites, or distention.   MSK:  No deformities or effusions. Neuro: Attention improved.  Moves all extremities with 5/5 strength, speech fluent.    Psych: Ranted to person, place, and time.  Mild psychomotor slowing noted, improved from yesterday.  Attention slightly diminished.  Affect blunted.         Data Reviewed: I have personally reviewed following labs and imaging studies:  CBC: Recent Labs  Lab 10/05/18 1447 10/06/18 0520 10/08/18 0732  WBC 8.3 11.4* 9.9  NEUTROABS 5.1  --   --   HGB 15.2 14.1 14.3  HCT 45.8 41.0 41.3  MCV 90.0 86.9 87.1  PLT 337 316 979   Basic Metabolic Panel: Recent Labs  Lab 10/05/18 1447 10/08/18 0732  NA 137 135  K 4.2 4.1  CL 104 102  CO2 25 24  GLUCOSE 115* 172*  BUN 14 17  CREATININE 1.02 1.21  CALCIUM 9.4 8.5*   GFR: Estimated Creatinine Clearance: 71.1 mL/min (by C-G formula based on SCr of 1.21 mg/dL). Liver Function Tests: Recent Labs  Lab 10/05/18 1447  AST 21  ALT 23  ALKPHOS 74  BILITOT 0.6  PROT 7.1  ALBUMIN 3.8   Recent Labs  Lab 10/05/18 1447  LIPASE 35   Recent Labs  Lab 10/05/18 1504  AMMONIA 24   Coagulation Profile: No results for input(s): INR, PROTIME in the last 168 hours. Cardiac Enzymes: No results for input(s): CKTOTAL, CKMB, CKMBINDEX, TROPONINI in the last 168 hours. BNP (last 3 results) No results for input(s): PROBNP in the last 8760 hours. HbA1C: Recent Labs    10/06/18 0520  HGBA1C 6.8*   CBG: Recent Labs  Lab 10/07/18 1613 10/07/18 2204 10/08/18 0608 10/08/18 0632 10/08/18 1136  GLUCAP 135* 138* 131* 149* 155*   Lipid Profile: Recent Labs    10/06/18 0520  CHOL 101  HDL 30*  LDLCALC 55  TRIG 79  CHOLHDL 3.4   Thyroid Function Tests: No results for input(s): TSH, T4TOTAL, FREET4, T3FREE, THYROIDAB in the last 72 hours. Anemia Panel: No results for input(s): VITAMINB12, FOLATE, FERRITIN, TIBC, IRON, RETICCTPCT in the last 72 hours. Urine analysis:    Component Value Date/Time   COLORURINE  YELLOW 10/05/2018 1510   APPEARANCEUR CLEAR 10/05/2018 1510   LABSPEC 1.019 10/05/2018 1510   PHURINE 5.0 10/05/2018 1510   GLUCOSEU NEGATIVE 10/05/2018 1510   HGBUR NEGATIVE 10/05/2018 1510   BILIRUBINUR NEGATIVE 10/05/2018 [place] 10/05/2018 1510   PROTEINUR 30 (A) 10/05/2018 1510   NITRITE NEGATIVE 10/05/2018 1510   LEUKOCYTESUR NEGATIVE 10/05/2018 1510   Sepsis Labs: @LABRCNTIP (procalcitonin:4,lacticacidven:4)  ) Recent Results (from the past 240 hour(s))  Blood culture (routine x 2)     Status: None (Preliminary result)   Collection Time: 10/05/18  2:30 PM  Result Value Ref Range Status   Specimen Description BLOOD LEFT ANTECUBITAL  Final   Special Requests   Final    BOTTLES DRAWN AEROBIC AND ANAEROBIC Blood Culture results may not be optimal due to an inadequate volume of blood received in culture bottles   Culture   Final    NO GROWTH 3 DAYS Performed at [facility], [place] [address] [place], [place] [address]    Report Status PENDING  Incomplete  Blood culture (routine x 2)     Status: None (Preliminary result)   Collection Time: 10/05/18  3:05 PM  Result Value Ref Range Status   Specimen Description BLOOD RIGHT HAND  Final   Special Requests   Final    BOTTLES DRAWN AEROBIC ONLY Blood Culture adequate volume   Culture   Final    NO GROWTH 3 DAYS Performed at [facility], [place] [address] [place], [place] [address]    Report Status PENDING  Incomplete  Urine culture     Status: None   Collection Time: 10/05/18  3:10 PM  Result Value Ref Range Status   Specimen Description URINE, CLEAN CATCH  Final   Special Requests NONE  Final   Culture   Final    NO GROWTH Performed at [facility], [place] [address] [place], [place] [address]    Report Status 10/06/2018 FINAL  Final  CSF culture     Status: None (Preliminary result)   Collection Time: 10/06/18  6:16 PM  Result Value Ref Range Status   Specimen Description CSF  Final    Special Requests TUBE 2  Final   Gram Stain   Final    WBC PRESENT, PREDOMINANTLY MONONUCLEAR NO ORGANISMS SEEN CYTOSPIN SMEAR    Culture  Final    NO GROWTH 2 DAYS Performed at [facility], [place] [address], [place], [place] [address]    Report Status PENDING  Incomplete         Radiology Studies: Dg Fluoro Guide Lumbar Puncture  Result Date: 10/06/2018 Margarette Canada, MD     10/06/2018  6:07 PM LP performed at L3-4 OP - 25 cm H2O 10 cc's clear colorless fluid obtained without complication       Scheduled Meds: . diazepam  5 mg Oral UD  . insulin aspart  0-15 Units Subcutaneous TID WC  . pantoprazole  40 mg Oral Daily   Continuous Infusions: . acyclovir 755 mg (10/08/18 1338)  . cefTRIAXone (ROCEPHIN)  IV 2 g (10/08/18 0819)     LOS: 3 days    Time spent: 25 minutes    Edwin Dada, MD [facility] 10/08/2018, 3:17 PM     Please page through [place]:  [email or web address] Password [email or web address] If 7PM-7AM, please contact night-coverage

## 2018-10-09 ENCOUNTER — Inpatient Hospital Stay: Payer: Self-pay

## 2018-10-09 DIAGNOSIS — R410 Disorientation, unspecified: Secondary | ICD-10-CM

## 2018-10-09 DIAGNOSIS — J329 Chronic sinusitis, unspecified: Secondary | ICD-10-CM

## 2018-10-09 LAB — VARICELLA-ZOSTER BY PCR: Varicella-Zoster, PCR: NEGATIVE

## 2018-10-09 LAB — BASIC METABOLIC PANEL
Anion gap: 8 (ref 5–15)
BUN: 14 mg/dL (ref 8–23)
CO2: 25 mmol/L (ref 22–32)
Calcium: 8.8 mg/dL — ABNORMAL LOW (ref 8.9–10.3)
Chloride: 104 mmol/L (ref 98–111)
Creatinine, Ser: 1.13 mg/dL (ref 0.61–1.24)
GFR calc Af Amer: >60 mL/min (ref >60)
GFR calc non Af Amer: >60 mL/min (ref >60)
Glucose, Bld: 167 mg/dL — ABNORMAL HIGH (ref 70–99)
Potassium: 3.7 mmol/L (ref 3.5–5.1)
Sodium: 137 mmol/L (ref 135–145)

## 2018-10-09 LAB — CBC
HCT: 40.7 % (ref 39.0–52.0)
Hemoglobin: 13.6 g/dL (ref 13.0–17.0)
MCH: 29 pg (ref 26.0–34.0)
MCHC: 33.4 g/dL (ref 30.0–36.0)
MCV: 86.8 fL (ref 80.0–100.0)
Platelets: 289 10*3/uL (ref 150–400)
RBC: 4.69 MIL/uL (ref 4.22–5.81)
RDW: 12.8 % (ref 11.5–15.5)
WBC: 9.1 10*3/uL (ref 4.0–10.5)
nRBC: 0 % (ref 0.0–0.2)

## 2018-10-09 LAB — GLUCOSE, CAPILLARY
Glucose-Capillary: 165 mg/dL — ABNORMAL HIGH (ref 70–99)
Glucose-Capillary: 126 mg/dL — ABNORMAL HIGH (ref 70–99)

## 2018-10-09 LAB — HERPES SIMPLEX VIRUS(HSV) DNA BY PCR: HSV 2 DNA: NEGATIVE

## 2018-10-09 LAB — HSV DNA BY PCR (REFERENCE LAB): HSV 1 DNA: NEGATIVE

## 2018-10-09 LAB — PATHOLOGIST SMEAR REVIEW: Path Review: INCREASED

## 2018-10-09 MED ORDER — ONDANSETRON HCL 4 MG/2ML IJ SOLN: 4.0000 mg | Freq: Four times a day (QID) | INTRAMUSCULAR | Status: DC | PRN

## 2018-10-09 MED ORDER — CEFTRIAXONE IV (FOR PTA / DISCHARGE USE ONLY): 2.0000 g | Freq: Two times a day (BID) | INTRAVENOUS | 0 refills | Status: AC

## 2018-10-09 MED ORDER — SODIUM CHLORIDE 0.9% FLUSH: 10.0000 mL | INTRAVENOUS | Status: DC | PRN

## 2018-10-09 MED ORDER — HYDROCODONE-ACETAMINOPHEN 5-325 MG PO TABS: 1.0000 | ORAL_TABLET | ORAL | 0 refills | Status: DC | PRN

## 2018-10-09 NOTE — Progress Notes (Signed)
PHARMACY CONSULT NOTE FOR:  OUTPATIENT  PARENTERAL ANTIBIOTIC THERAPY (OPAT)  Indication: Bacterial meningitis Regimen: Ceftriaxone 2 gm every 12 hours End date: 10/16/2018  IV antibiotic discharge orders are pended. To discharging provider:  please sign these orders via discharge navigator,  Select New Orders & click on the button choice - Manage This Unsigned Work.     Thank you for allowing pharmacy to be a part of this patient's care.  Jimmy Footman, PharmD, BCPS, [place] [facility] Clinical Pharmacist Phone: [phone] 10/09/2018, 11:08 AM

## 2018-10-09 NOTE — Progress Notes (Signed)
Subjective: No new complaints, headache is better   Antibiotics:  Anti-infectives (From admission, onward)   Start     Dose/Rate Route Frequency Ordered Stop   10/07/18 0600  vancomycin (VANCOCIN) IVPB 1000 mg/200 mL premix  Status:  Discontinued     1,000 mg 200 mL/hr over 60 Minutes Intravenous Every 8 hours 10/06/18 2010 10/08/18 1413   10/06/18 2200  acyclovir (ZOVIRAX) 755 mg in dextrose 5 % 150 mL IVPB  Status:  Discontinued     10 mg/kg  75.3 kg (Ideal) 165.1 mL/hr over 60 Minutes Intravenous Every 8 hours 10/06/18 2010 10/09/18 0858   10/06/18 2015  cefTRIAXone (ROCEPHIN) 2 g in sodium chloride 0.9 % 100 mL IVPB     2 g 200 mL/hr over 30 Minutes Intravenous Every 12 hours 10/06/18 2002     10/06/18 2015  vancomycin (VANCOCIN) 2,000 mg in sodium chloride 0.9 % 500 mL IVPB     2,000 mg 250 mL/hr over 120 Minutes Intravenous  Once 10/06/18 2010 10/06/18 2308   10/06/18 1000  doxycycline (VIBRA-TABS) tablet 100 mg  Status:  Discontinued    Note to Pharmacy:  For 10 days     100 mg Oral 2 times daily 10/06/18 4627 10/06/18 1812      Medications: Scheduled Meds: . diazepam  5 mg Oral UD  . insulin aspart  0-15 Units Subcutaneous TID WC  . pantoprazole  40 mg Oral Daily   Continuous Infusions: . cefTRIAXone (ROCEPHIN)  IV 2 g (10/08/18 2009)   PRN Meds:.acetaminophen **OR** acetaminophen (TYLENOL) oral liquid 160 mg/5 mL **OR** acetaminophen, HYDROcodone-acetaminophen, ondansetron (ZOFRAN) IV    Objective: Weight change:  No intake or output data in the 24 hours ending 10/09/18 1040 Blood pressure (!) 128/95, pulse 99, temperature 97.9 F (36.6 C), temperature source Oral, resp. rate 16, height 5\' 11"  (1.803 m), weight 102.1 kg, SpO2 95 %. Temp:  [97.6 F (36.4 C)-98.6 F (37 C)] 97.9 F (36.6 C) (02/04 0800) Pulse Rate:  [84-99] 99 (02/04 0800) Resp:  [16-20] 16 (02/04 0800) BP: (121-131)/(71-97) 128/95 (02/04 0800) SpO2:  [95 %-99 %] 95 % (02/04  0800)  Physical Exam: General: Alert and awake, oriented x3, not in any acute distress. HEENT: anicteric sclera, EOMI, he does not have frontal sinus tenderness or tenderness about his ears CVS regular rate, normal no murmurs gallops or rubs heard Chest: , no wheezing, no respiratory distress's are clear to auscultation bilaterally without rhonchi Abdomen: soft non-distended, nontender Extremities: no edema or deformity noted bilaterally Skin: no rashes Neuro: nonfocal  CBC:    BMET Recent Labs    10/08/18 0732 10/09/18 0611  NA 135 137  K 4.1 3.7  CL 102 104  CO2 24 25  GLUCOSE 172* 167*  BUN 17 14  CREATININE 1.21 1.13  CALCIUM 8.5* 8.8*     Liver Panel  No results for input(s): PROT, ALBUMIN, AST, ALT, ALKPHOS, BILITOT, BILIDIR, IBILI in the last 72 hours.     Sedimentation Rate Recent Labs    10/06/18 1439  ESRSEDRATE 4   C-Reactive Protein Recent Labs    10/06/18 1439  CRP <0.8    Micro Results: Recent Results (from the past 720 hour(s))  Blood culture (routine x 2)     Status: None (Preliminary result)   Collection Time: 10/05/18  2:30 PM  Result Value Ref Range Status   Specimen Description BLOOD LEFT ANTECUBITAL  Final   Special Requests  Final    BOTTLES DRAWN AEROBIC AND ANAEROBIC Blood Culture results may not be optimal due to an inadequate volume of blood received in culture bottles   Culture   Final    NO GROWTH 4 DAYS Performed at [facility], [place] [address], [place], [place] [address]    Report Status PENDING  Incomplete  Blood culture (routine x 2)     Status: None (Preliminary result)   Collection Time: 10/05/18  3:05 PM  Result Value Ref Range Status   Specimen Description BLOOD RIGHT HAND  Final   Special Requests   Final    BOTTLES DRAWN AEROBIC ONLY Blood Culture adequate volume   Culture   Final    NO GROWTH 4 DAYS Performed at [facility], [place] [address], [place], [place] [address]    Report Status  PENDING  Incomplete  Urine culture     Status: None   Collection Time: 10/05/18  3:10 PM  Result Value Ref Range Status   Specimen Description URINE, CLEAN CATCH  Final   Special Requests NONE  Final   Culture   Final    NO GROWTH Performed at [facility], [place] [address], [place], [place] [address]    Report Status 10/06/2018 FINAL  Final  CSF culture     Status: None (Preliminary result)   Collection Time: 10/06/18  6:16 PM  Result Value Ref Range Status   Specimen Description CSF  Final   Special Requests TUBE 2  Final   Gram Stain   Final    WBC PRESENT, PREDOMINANTLY MONONUCLEAR NO ORGANISMS SEEN CYTOSPIN SMEAR    Culture   Final    NO GROWTH 3 DAYS Performed at [facility], [place] [address], [place], [place] [address]    Report Status PENDING  Incomplete    Studies/Results: Mr Mrv Head Wo Cm  Result Date: 10/08/2018 CLINICAL DATA:  68 y/o M; bacterial meningitis seated from otitis media. EXAM: MR VENOGRAM the HEAD WITHOUT CONTRAST TECHNIQUE: Angiographic images of the intracranial venous structures were obtained using MRV technique without intravenous contrast. COMPARISON:  10/05/2018 CTA head. FINDINGS: Patent superior sagittal sinus, bilateral transverse sinus, bilateral sigmoid sinus, bilateral upper internal jugular veins, the straight sinus, bilateral internal cerebral veins, and the basal veins of [place]. Anatomic variant asymmetry in transverse sinus drainage systems, right larger than left. IMPRESSION: Patent dural venous sinuses.  No evidence of sinus thrombosis. Electronically Signed   By: Kristine Garbe M.D.   On: 10/08/2018 18:07   [place] Ekg Site Rite  Result Date: 10/09/2018 If Site Rite image not attached, placement could not be confirmed due to current cardiac rhythm.     Assessment/Plan:  INTERVAL HISTORY: MRV was normal herpes simplex PCR's were negative   Principal Problem:   Delirium Active Problems:   DM (diabetes mellitus),  type 2 with renal complications (HCC)   Hyperlipidemia   Sinusitis    [name] is a 68 y.o. male with apparent culture-negative meningitis due to partially treated bacterial process.  #1 Likely partially treated bacterial meningitis:  Fine to discontinue acyclovir  Would finish a 10-day course of high-dose ceftriaxone.  I will follow-up the results of his culture but otherwise sign off for now please call with further questions.       LOS: 4 days   [name] 10/09/2018, 10:40 AM

## 2018-10-09 NOTE — Progress Notes (Signed)
Peripherally Inserted Central Catheter/Midline Placement  The IV Nurse has discussed with the patient and/or persons authorized to consent for the patient, the purpose of this procedure and the potential benefits and risks involved with this procedure.  The benefits include less needle sticks, lab draws from the catheter, and the patient may be discharged home with the catheter. Risks include, but not limited to, infection, bleeding, blood clot (thrombus formation), and puncture of an artery; nerve damage and irregular heartbeat and possibility to perform a PICC exchange if needed/ordered by physician.  Alternatives to this procedure were also discussed.  Bard Power PICC patient education guide, fact sheet on infection prevention and patient information card has been provided to patient /or left at bedside.    PICC/Midline Placement Documentation  PICC Single Lumen 34/35/68 PICC Right Basilic 38 cm 0 cm (Active)  Indication for Insertion or Continuance of Line Prolonged intravenous therapies 10/09/2018 11:30 AM  Exposed Catheter (cm) 0 cm 10/09/2018 11:30 AM  Site Assessment Clean;Dry;Intact 10/09/2018 11:30 AM  Line Status Flushed;Blood return noted;Saline locked 10/09/2018 11:30 AM  Dressing Type Transparent 10/09/2018 11:30 AM  Dressing Status Clean;Dry;Intact;Antimicrobial disc in place 10/09/2018 11:30 AM  Dressing Change Due 10/16/18 10/09/2018 11:30 AM       [address] 10/09/2018, 11:37 AM

## 2018-10-09 NOTE — Evaluation (Signed)
SLP Cancellation Note  Patient Details Name: ANOTHONY BURSCH MRN: [id number] DOB: 13-Nov-1950   Cancelled treatment:        pt working with PT, will continue efforts.   Luanna Salk, MS [facility] SLP Acute Rehab Services Pager [phone] Office [phone]    Macario Golds 10/09/2018, 12:19 PM

## 2018-10-09 NOTE — Care Management Note (Signed)
Case Management Note  Patient Details  Name: [name] MRN: [id number] Date of Birth: 1951/09/01  Subjective/Objective:       Pt admitted with delirium. He is from home with spouse.               Action/Plan: Pt discharging home with total of 10 days of IV abx. CM met with the patient and his son and provided choice. They selected [facility]. Pam and Butch Penny with [facility] notified and accepted the referral. Pam is to meet with family around 3 pm for education.  Family to provide transportation home.   Expected Discharge Date:  10/09/18               Expected Discharge Plan:  [place]  In-House Referral:     Discharge planning Services  CM Consult  Post Acute Care Choice:  Home Health Choice offered to:  Patient  DME Arranged:    DME Agency:     HH Arranged:  RN [place] Agency:     Status of Service:  Completed, signed off  If discussed at [employer] of Stay Meetings, dates discussed:    Additional Comments:  Pollie Friar, RN 10/09/2018, 2:11 PM

## 2018-10-09 NOTE — Progress Notes (Signed)
Inpatient Rehabilitation Admissions Coordinator  Outpatient therapies are now recommended. I have alerted RN CM of recommendations. No need for an inpt rehab admit at this time.  Danne Baxter, RN, MSN Rehab Admissions Coordinator [phone] 10/09/2018 11:49 AM

## 2018-10-09 NOTE — Discharge Summary (Signed)
Physician Discharge Summary  [name] TIW:[id number] DOB: 1951/04/26 DOA: 10/05/2018  PCP: Curly Rim, MD  Admit date: 10/05/2018 Discharge date: 10/09/2018  Admitted From: Home  Disposition:  Home with [facility]   Recommendations for Outpatient Follow-up:  1. Follow up with PCP in 1 week 2. Consider outpatient neurology referral for follow up 3. Please obtain BMP/CBC in one week 4. Please pull PICC line after the last dose of antibiotics      Home Health: Yes, [facility]  Equipment/Devices: IV pump  Discharge Condition: Good  CODE STATUS: FULL Diet recommendation: Regular  Brief/Interim Summary: [name] is a 68 y.o. M with hx NIDDM, hyperlipidemia who presents with sinusitis followed by severe ear pain and confusion and brief episodes of aphasia.  Patient was in [place] until 1 week ago, developed sinus pressure and nasal congestion.  Treated with Augmentin, but developed unilateral ear pain, so severe he was crying in pain.  Changed to doxycycline, given IM steroid shot, and shortly after developed confusion, disorientation.  Sent to ER where he initially appeared to clear mentally, but then had recurrence of aphasia, shooting ear pain and marked agitation, combativeness, disorientation.  Given concern for stuttering stroke, tPA was administered and patient was admitted to Neurology service.     PRINCIPAL HOSPITAL DIAGNOSIS:   Suspected/probable partially treated bacterial meningitis    Discharge Diagnoses:    Suspected/probable partially treated meningitis Less likely but possible viral meningitis.  Initially admitted and given tPA for concern stroke.  Subsequent MRI brain normal, no focal neurological deficits.    LP was then obtained and showed >50 WBCs, lymphocyte predominant, no red cells.  Protein slightly up, glucose normal. Initially treated with IV acyclovir, vancomycin and ceftriaxone.   Culture negative at 3 days.  HSV PCR negative.  ID were  consulted, and they and Neurology concurred that this was very likely partially treated meningitis, as an extension of otitis media, and that aggressive treatment with 10 days ceftriaxone 2g every twelve hours was warranted.   Acute metabolic encephalopathy The patient's initial presenting confusion appears to have been an acute steroid induced delirium, compounded by benzodiazepines given in the ER.  He had no focal deficits.  MRI brain was negative for stroke.  Echo negative for potential cardiogenic source of embolism. Carotid imaging normal.  He was re-evaluated by Neurology after MRI brain and felt to have had partially treated meningitis and no stroke.     Sinusitis Covered by above antibiotics.  Type 2 diabetes, uncomplicated, non-insulin dependent A1c well controlled            Discharge Instructions  Discharge Instructions    Home infusion instructions [facility] May follow [place] Dosing Protocol; May administer Cathflo as needed to maintain patency of vascular access device.; Flushing of vascular access device: per [facility] Protocol: 0.9% NaCl pre/post medica...   Complete by:  As directed    Instructions:  May follow [place] Dosing Protocol   Instructions:  May administer Cathflo as needed to maintain patency of vascular access device.   Instructions:  Flushing of vascular access device: per [facility] Protocol: 0.9% NaCl pre/post medication administration and prn patency; Heparin 100 u/ml, 29m for implanted ports and Heparin 10u/ml, 542mfor all other central venous catheters.   Instructions:  May follow [facility] Anaphylaxis Protocol for First Dose Administration in the home: 0.9% NaCl at 25-50 ml/hr to maintain IV access for protocol meds. Epinephrine 0.3 ml IV/IM PRN and Benadryl 25-50 IV/IM  PRN s/s of anaphylaxis.   Instructions:  [place] [place] Infusion Coordinator (RN) to assist per patient IV care needs in the home PRN.   Increase activity slowly    Complete by:  As directed      Allergies as of 10/09/2018   No Known Allergies     Medication List    STOP taking these medications   doxycycline 100 MG capsule Commonly known as:  VIBRAMYCIN     TAKE these medications   acetaminophen 500 MG tablet Commonly known as:  TYLENOL Take 1,000 mg by mouth 2 (two) times daily as needed for mild pain.   azelastine 0.1 % nasal spray Commonly known as:  ASTELIN Place 1 spray into both nostrils 2 (two) times daily. Use in each nostril as directed   cefTRIAXone  IVPB Commonly known as:  ROCEPHIN Inject 2 g into the vein every 12 (twelve) hours for 7 days. Indication:  Meningitis Last Day of Therapy:  10/16/18 Labs - Once weekly:  CBC/D and BMP, Labs - Every other week:  ESR and CRP   cholecalciferol 1000 units tablet Commonly known as:  VITAMIN D Take 1,000 Units by mouth daily.   diazepam 5 MG tablet Commonly known as:  VALIUM Take 5 mg by mouth at bedtime as needed for sleep.   FLUoxetine 10 MG capsule Commonly known as:  PROZAC Take 1 capsule by mouth daily.   fluticasone 50 MCG/ACT nasal spray Commonly known as:  FLONASE Place 1 spray into both nostrils daily.   HYDROcodone-acetaminophen 5-325 MG tablet Commonly known as:  NORCO/VICODIN Place 1 tablet into feeding tube every 4 (four) hours as needed for moderate pain or severe pain (Do not give hydrocodone with his prn acetaminophen - give one or the other.).   HYDROcodone-homatropine 5-1.5 MG/5ML syrup Commonly known as:  HYCODAN Take 5 mLs by mouth every 6 (six) hours as needed for cough.   ibuprofen 800 MG tablet Commonly known as:  ADVIL,MOTRIN Take 800 mg by mouth 3 (three) times daily as needed for moderate pain.   metFORMIN 500 MG tablet Commonly known as:  GLUCOPHAGE Take 1 tablet by mouth daily.   omeprazole 20 MG capsule Commonly known as:  PRILOSEC Take 20 mg by mouth daily.   rosuvastatin 10 MG tablet Commonly known as:  CRESTOR Take 10 mg by mouth  daily.   SAM-e 400 MG Tabs Take 1 tablet by mouth daily.            Home Infusion Instuctions  (From admission, onward)         Start     Ordered   10/09/18 0000  Home infusion instructions [facility] May follow [place] Dosing Protocol; May administer Cathflo as needed to maintain patency of vascular access device.; Flushing of vascular access device: per [facility] Protocol: 0.9% NaCl pre/post medica...    Question Answer Comment  Instructions May follow [facility] Dosing Protocol   Instructions May administer Cathflo as needed to maintain patency of vascular access device.   Instructions Flushing of vascular access device: per [facility] Protocol: 0.9% NaCl pre/post medication administration and prn patency; Heparin 100 u/ml, 45m for implanted ports and Heparin 10u/ml, 576mfor all other central venous catheters.   Instructions May follow [facility] Anaphylaxis Protocol for First Dose Administration in the home: 0.9% NaCl at 25-50 ml/hr to maintain IV access for protocol meds. Epinephrine 0.3 ml IV/IM PRN and Benadryl 25-50 IV/IM PRN s/s of anaphylaxis.   Instructions [facility] Infusion Coordinator (RN) to assist per patient IV care needs in the home PRN.      10/09/18 1518          No Known Allergies  Consultations:  Neurology  Infectious disease   Procedures/Studies: Ct Angio Head W Or Wo Contrast  Result Date: 10/05/2018 CLINICAL DATA:  Aphasia which began several hours ago. EXAM: CT ANGIOGRAPHY HEAD AND NECK TECHNIQUE: Multidetector CT imaging of the head and neck was performed using the standard protocol during bolus administration of intravenous contrast. Multiplanar CT image reconstructions and MIPs were obtained to evaluate the vascular anatomy. Carotid stenosis measurements (when applicable) are obtained utilizing NASCET criteria, using the distal internal carotid diameter as the denominator. CONTRAST:  62m ISOVUE-370 IOPAMIDOL (ISOVUE-370) INJECTION 76%  COMPARISON:  CT head earlier today. FINDINGS: There is significant motion degradation. Small or subtle abnormalities could be overlooked. CTA NECK FINDINGS Aortic arch: Standard branching. Imaged portion shows no evidence of aneurysm or dissection. No significant stenosis of the major arch vessel origins. Right carotid system: Patency is established. Presence or absence of flow reducing lesion at the bifurcation difficult to confirm. Left carotid system: Patency is established. Presence or absence of a flow reducing lesion at the bifurcation difficult to confirm. Vertebral arteries: RIGHT vertebral is the dominant contributor. LEFT vertebral is diminutive. Assessment of ostial narrowing or disease in the neck is difficult to confirm. Skeleton: Spondylosis. Other neck: No gross mass lesion. Upper chest: No visible pneumothorax. Review of the MIP images confirms the above findings CTA HEAD FINDINGS Anterior circulation: No large vessel occlusion. The distal ICA, proximal ACA, and proximal MCA segments appear patent. Posterior circulation: No large vessel occlusion. RIGHT vertebral dominant contributor to the basilar. Venous sinuses: Grossly patent. Anatomic variants: Not established. Delayed phase: Not performed. Review of the MIP images confirms the above findings IMPRESSION: 1. Severely motion degraded examination. No extracranial or intracranial flow reducing lesion is observed. Patency of the extracranial and intracranial circulation is established. 2. These results were discussed directly at the time of interpretation on 10/05/2018 at 4:45 pm to Dr. MRoland Rack, who verbally acknowledged these results. Electronically Signed   By: JStaci RighterM.D.   On: 10/05/2018 17:04   Dg Chest 2 View  Result Date: 10/05/2018 CLINICAL DATA:  Acute onset altered mental status today. EXAM: CHEST - 2 VIEW COMPARISON:  None. FINDINGS: The lungs are clear. Elevation of the right hemidiaphragm relative to the left  noted. Heart size is normal. No pneumothorax or pleural fluid. No acute or focal bony abnormality. IMPRESSION: No acute disease. Electronically Signed   By: TInge RiseM.D.   On: 10/05/2018 16:12   Ct Head Wo Contrast  Result Date: 10/06/2018 CLINICAL DATA:  Neuro deficit(s), subacute neuro change, patient less responsive, more sleepy, running fever (100.60F), confused. EXAM: CT HEAD WITHOUT CONTRAST TECHNIQUE: Contiguous axial images were obtained from the base of the skull through the vertex without intravenous contrast. COMPARISON:  10/05/2018 CT head and CTA head. FINDINGS: Brain: No evidence of acute infarction, hemorrhage, hydrocephalus, extra-axial collection or mass lesion/mass effect. Stable chronic microvascular ischemic changes and volume loss of the brain. Vascular: Persistent contrast enhancement of the intracranial vessels. Skull: Normal. Negative for fracture or focal lesion. Sinuses/Orbits: Diffuse paranasal sinus mucosal thickening and fluid levels within the maxillary sinuses. Normal aeration of mastoid air cells. Orbits are unremarkable. Other: None. IMPRESSION: 1. No acute intracranial abnormality identified. 2. Stable chronic microvascular ischemic changes and volume loss of the brain. 3.  Sinus disease with maxillary fluid levels which may represent acute sinusitis. Electronically Signed   By: Kristine Garbe M.D.   On: 10/06/2018 01:18   Ct Head Wo Contrast  Result Date: 10/05/2018 CLINICAL DATA:  Headache and left-sided facial pain for 1 week. EXAM: CT HEAD WITHOUT CONTRAST CT MAXILLOFACIAL WITHOUT CONTRAST TECHNIQUE: Multidetector CT imaging of the head and maxillofacial structures were performed using the standard protocol without intravenous contrast. Multiplanar CT image reconstructions of the maxillofacial structures were also generated. COMPARISON:  None. FINDINGS: CT HEAD FINDINGS Brain: Mild diffuse cortical atrophy is noted. Mild chronic ischemic white matter  disease is noted. No mass effect or midline shift is noted. Ventricular size is within normal limits. There is no evidence of mass lesion, hemorrhage or acute infarction. Vascular: No hyperdense vessel or unexpected calcification. Skull: Normal. Negative for fracture or focal lesion. Other: None. CT MAXILLOFACIAL FINDINGS Osseous: No fracture or mandibular dislocation. No destructive process. Orbits: Negative. No traumatic or inflammatory finding. Sinuses: Bilateral maxillary sinusitis is noted with air-fluid levels. Soft tissues: Negative. IMPRESSION: Mild diffuse cortical atrophy. Mild chronic ischemic white matter disease. No acute intracranial abnormality seen. Bilateral maxillary sinusitis is noted. Electronically Signed   By: Marijo Conception, M.D.   On: 10/05/2018 15:50   Ct Angio Neck W Or Wo Contrast  Result Date: 10/05/2018 CLINICAL DATA:  Aphasia which began several hours ago. EXAM: CT ANGIOGRAPHY HEAD AND NECK TECHNIQUE: Multidetector CT imaging of the head and neck was performed using the standard protocol during bolus administration of intravenous contrast. Multiplanar CT image reconstructions and MIPs were obtained to evaluate the vascular anatomy. Carotid stenosis measurements (when applicable) are obtained utilizing NASCET criteria, using the distal internal carotid diameter as the denominator. CONTRAST:  53m ISOVUE-370 IOPAMIDOL (ISOVUE-370) INJECTION 76% COMPARISON:  CT head earlier today. FINDINGS: There is significant motion degradation. Small or subtle abnormalities could be overlooked. CTA NECK FINDINGS Aortic arch: Standard branching. Imaged portion shows no evidence of aneurysm or dissection. No significant stenosis of the major arch vessel origins. Right carotid system: Patency is established. Presence or absence of flow reducing lesion at the bifurcation difficult to confirm. Left carotid system: Patency is established. Presence or absence of a flow reducing lesion at the bifurcation  difficult to confirm. Vertebral arteries: RIGHT vertebral is the dominant contributor. LEFT vertebral is diminutive. Assessment of ostial narrowing or disease in the neck is difficult to confirm. Skeleton: Spondylosis. Other neck: No gross mass lesion. Upper chest: No visible pneumothorax. Review of the MIP images confirms the above findings CTA HEAD FINDINGS Anterior circulation: No large vessel occlusion. The distal ICA, proximal ACA, and proximal MCA segments appear patent. Posterior circulation: No large vessel occlusion. RIGHT vertebral dominant contributor to the basilar. Venous sinuses: Grossly patent. Anatomic variants: Not established. Delayed phase: Not performed. Review of the MIP images confirms the above findings IMPRESSION: 1. Severely motion degraded examination. No extracranial or intracranial flow reducing lesion is observed. Patency of the extracranial and intracranial circulation is established. 2. These results were discussed directly at the time of interpretation on 10/05/2018 at 4:45 pm to Dr. MRoland Rack, who verbally acknowledged these results. Electronically Signed   By: JStaci RighterM.D.   On: 10/05/2018 17:04   Mr MJodene NamNeck W Wo Contrast  Result Date: 10/06/2018 CLINICAL DATA:  Patient became confused after administration of high-dose steroids. EXAM: MRI HEAD WITHOUT CONTRAST MRA NECK WITHOUT AND WITH CONTRAST TECHNIQUE: Multiplanar, multiecho pulse sequences of the brain and surrounding structures  were obtained without intravenous contrast. Angiographic images of the neck were obtained using MRA technique with and without intravenous contrast. Carotid stenosis measurements (when applicable) are obtained utilizing NASCET criteria, using the distal internal carotid diameter as the denominator. CONTRAST:  Gadavist 10 mL. COMPARISON:  CT head without contrast, CT maxillofacial without contrast, CTA head neck with contrast performed 10/05/2018. FINDINGS: MRI HEAD FINDINGS Brain: No  acute stroke, hemorrhage, mass lesion, hydrocephalus, or extra-axial fluid. Mild atrophy. Mild subcortical and periventricular T2 and FLAIR hyperintensities, likely chronic microvascular ischemic change. Prominent ventricles, likely central atrophy. Vascular: Patency of intracranial vasculature is established based on flow void. Skull and upper cervical spine: No acute findings. Sinuses/Orbits: Significant paranasal sinus disease, layering BILATERAL maxillary fluid, also mucosal thickening in the ethmoid, sphenoid, and frontal sinuses. Negative orbits. Other: None. MRA NECK FINDINGS Conventional branching of the great vessels from the arch. No proximal stenosis. Carotid bifurcations are free of disease. There is moderate dolichoectasia of the cervical ICA segments. No evidence of carotid stenosis or fibromuscular dysplasia. No carotid dissection. RIGHT vertebral is dominant but both vertebrals are patent. Rudimentary connection of the LEFT vertebral to the basilar. RIGHT vertebral is widely patent throughout its vertebral segments and is the dominant contributor to the basilar. No dissection or ostial narrowing. IMPRESSION: MRI brain demonstrates no acute or focal abnormality. Specifically no evidence for acute stroke. Chronic and acute sinusitis. No extracranial stenosis or dissection.  Moderate dolichoectasia. Electronically Signed   By: Staci Righter M.D.   On: 10/06/2018 14:18   Mr Brain Wo Contrast (neuro Protocol)  Result Date: 10/06/2018 CLINICAL DATA:  Patient became confused after administration of high-dose steroids. EXAM: MRI HEAD WITHOUT CONTRAST MRA NECK WITHOUT AND WITH CONTRAST TECHNIQUE: Multiplanar, multiecho pulse sequences of the brain and surrounding structures were obtained without intravenous contrast. Angiographic images of the neck were obtained using MRA technique with and without intravenous contrast. Carotid stenosis measurements (when applicable) are obtained utilizing NASCET criteria,  using the distal internal carotid diameter as the denominator. CONTRAST:  Gadavist 10 mL. COMPARISON:  CT head without contrast, CT maxillofacial without contrast, CTA head neck with contrast performed 10/05/2018. FINDINGS: MRI HEAD FINDINGS Brain: No acute stroke, hemorrhage, mass lesion, hydrocephalus, or extra-axial fluid. Mild atrophy. Mild subcortical and periventricular T2 and FLAIR hyperintensities, likely chronic microvascular ischemic change. Prominent ventricles, likely central atrophy. Vascular: Patency of intracranial vasculature is established based on flow void. Skull and upper cervical spine: No acute findings. Sinuses/Orbits: Significant paranasal sinus disease, layering BILATERAL maxillary fluid, also mucosal thickening in the ethmoid, sphenoid, and frontal sinuses. Negative orbits. Other: None. MRA NECK FINDINGS Conventional branching of the great vessels from the arch. No proximal stenosis. Carotid bifurcations are free of disease. There is moderate dolichoectasia of the cervical ICA segments. No evidence of carotid stenosis or fibromuscular dysplasia. No carotid dissection. RIGHT vertebral is dominant but both vertebrals are patent. Rudimentary connection of the LEFT vertebral to the basilar. RIGHT vertebral is widely patent throughout its vertebral segments and is the dominant contributor to the basilar. No dissection or ostial narrowing. IMPRESSION: MRI brain demonstrates no acute or focal abnormality. Specifically no evidence for acute stroke. Chronic and acute sinusitis. No extracranial stenosis or dissection.  Moderate dolichoectasia. Electronically Signed   By: Staci Righter M.D.   On: 10/06/2018 14:18   Mr Mrv Head Wo Cm  Result Date: 10/08/2018 CLINICAL DATA:  68 y/o M; bacterial meningitis seated from otitis media. EXAM: MR VENOGRAM the HEAD WITHOUT CONTRAST TECHNIQUE: Angiographic images  of the intracranial venous structures were obtained using MRV technique without intravenous  contrast. COMPARISON:  10/05/2018 CTA head. FINDINGS: Patent superior sagittal sinus, bilateral transverse sinus, bilateral sigmoid sinus, bilateral upper internal jugular veins, the straight sinus, bilateral internal cerebral veins, and the basal veins of [place]. Anatomic variant asymmetry in transverse sinus drainage systems, right larger than left. IMPRESSION: Patent dural venous sinuses.  No evidence of sinus thrombosis. Electronically Signed   By: Kristine Garbe M.D.   On: 10/08/2018 18:07   Ct Head Code Stroke Wo Contrast  Result Date: 10/05/2018 CLINICAL DATA:  Code stroke. Imbalance and confusion. Headache and facial pain. EXAM: CT HEAD WITHOUT CONTRAST TECHNIQUE: Contiguous axial images were obtained from the base of the skull through the vertex without intravenous contrast. COMPARISON:  Head CT 10/05/2018 at 1520 hours FINDINGS: Brain: No definite acute infarct, intracranial hemorrhage, mass, midline shift, or extra-axial fluid collection is identified within limitations of mild to slightly moderate motion artifact. Slight asymmetric hypoattenuation in the central portion of the right cerebellar hemispheres favored to be artifactual. Mild cerebral atrophy is again noted. Cerebral white matter hypodensities are nonspecific but compatible with mild chronic small vessel ischemic disease. Vascular: Mild calcified atherosclerosis at the skull base. No hyperdense vessel. Skull: No fracture or focal osseous lesion. Sinuses/Orbits: Prominent bilateral maxillary sinus fluid levels, unchanged. Mild bilateral ethmoid air cell mucosal thickening. Clear mastoid air cells. Unremarkable orbits. Other: None. ASPECTS [facility] Stroke Program Early CT Score) Not scored with this history. IMPRESSION: 1. No evidence of acute intracranial abnormality within limitations of motion artifact. 2. Mild chronic small vessel ischemic disease and cerebral atrophy. These results were communicated to Dr. Leonel Ramsay at  4:24 pm on 10/05/2018 by text page via the [facility] messaging system. Electronically Signed   By: Logan Bores M.D.   On: 10/05/2018 16:24   [place] Ekg Site Rite  Result Date: 10/09/2018 If Site Rite image not attached, placement could not be confirmed due to current cardiac rhythm.  Ct Maxillofacial Wo Contrast  Result Date: 10/05/2018 CLINICAL DATA:  Headache and left-sided facial pain for 1 week. EXAM: CT HEAD WITHOUT CONTRAST CT MAXILLOFACIAL WITHOUT CONTRAST TECHNIQUE: Multidetector CT imaging of the head and maxillofacial structures were performed using the standard protocol without intravenous contrast. Multiplanar CT image reconstructions of the maxillofacial structures were also generated. COMPARISON:  None. FINDINGS: CT HEAD FINDINGS Brain: Mild diffuse cortical atrophy is noted. Mild chronic ischemic white matter disease is noted. No mass effect or midline shift is noted. Ventricular size is within normal limits. There is no evidence of mass lesion, hemorrhage or acute infarction. Vascular: No hyperdense vessel or unexpected calcification. Skull: Normal. Negative for fracture or focal lesion. Other: None. CT MAXILLOFACIAL FINDINGS Osseous: No fracture or mandibular dislocation. No destructive process. Orbits: Negative. No traumatic or inflammatory finding. Sinuses: Bilateral maxillary sinusitis is noted with air-fluid levels. Soft tissues: Negative. IMPRESSION: Mild diffuse cortical atrophy. Mild chronic ischemic white matter disease. No acute intracranial abnormality seen. Bilateral maxillary sinusitis is noted. Electronically Signed   By: Marijo Conception, M.D.   On: 10/05/2018 15:50   Dg Fluoro Guide Lumbar Puncture  Result Date: 10/06/2018 CLINICAL DATA:  68 year old male for lumbar puncture. Fever of unknown origin. EXAM: DIAGNOSTIC LUMBAR PUNCTURE UNDER FLUOROSCOPIC GUIDANCE FLUOROSCOPY TIME:  Fluoroscopy Time:  12 seconds Number of Acquired Spot Images: 1 PROCEDURE: Written informed consent was  obtained from the patient's wife prior to the procedure, including potential complications of headache, allergy, and pain. With the patient  prone, the lower back was prepped with Betadine. 1% Lidocaine was used for local anesthesia. Lumbar puncture was performed at the L3-4 level using a 20 gauge needle with return of clear colorless CSF with an opening pressure of 25 cm water. 10 ml of CSF were obtained for laboratory studies. The patient tolerated the procedure well and there were no apparent complications. IMPRESSION: Fluoroscopic guided lumbar puncture without immediate complication. 10 ml of clear colorless CSF obtained. Electronically Signed   By: Margarette Canada M.D.   On: 10/06/2018 18:45       Subjective: No pain.  Feels tired.  Mild headache only.  No confusion, seizures.  Mild right sided weakness noted by PT, not by patient.  No vomiting.  Discharge Exam: Vitals:   10/09/18 0800 10/09/18 1240  BP: (!) 128/95 (!) 142/90  Pulse: 99 86  Resp: 16 18  Temp: 97.9 F (36.6 C) 98.1 F (36.7 C)  SpO2: 95% 97%   Vitals:   10/08/18 2352 10/09/18 0418 10/09/18 0800 10/09/18 1240  BP: 121/86 (!) 131/96 (!) 128/95 (!) 142/90  Pulse: 92 98 99 86  Resp: 17 17 16 18   Temp: 98.2 F (36.8 C) 98.2 F (36.8 C) 97.9 F (36.6 C) 98.1 F (36.7 C)  TempSrc: Oral Oral Oral Oral  SpO2: 97% 98% 95% 97%  Weight:      Height:        General: Pt is alert to me, interactive, sitting in chair, awake, not in acute distress Cardiovascular: RRR, nl S1-S2, no murmurs appreciated.   No LE edema.   Respiratory: Normal respiratory rate and rhythm.  CTAB without rales or wheezes. Neuro/Psych: Cranial nerves normal. I do not appreciate a difference in motor strenght in upper extremities.  Judgment and insight appear normal, mild psychomotor slowing.   The results of significant diagnostics from this hospitalization (including imaging, microbiology, ancillary and laboratory) are listed below for reference.      Microbiology: Recent Results (from the past 240 hour(s))  Blood culture (routine x 2)     Status: None (Preliminary result)   Collection Time: 10/05/18  2:30 PM  Result Value Ref Range Status   Specimen Description BLOOD LEFT ANTECUBITAL  Final   Special Requests   Final    BOTTLES DRAWN AEROBIC AND ANAEROBIC Blood Culture results may not be optimal due to an inadequate volume of blood received in culture bottles   Culture   Final    NO GROWTH 4 DAYS Performed at [facility], [place] [address], [place], [place] [address]    Report Status PENDING  Incomplete  Blood culture (routine x 2)     Status: None (Preliminary result)   Collection Time: 10/05/18  3:05 PM  Result Value Ref Range Status   Specimen Description BLOOD RIGHT HAND  Final   Special Requests   Final    BOTTLES DRAWN AEROBIC ONLY Blood Culture adequate volume   Culture   Final    NO GROWTH 4 DAYS Performed at [facility], [place] [address] [place], [place] [address]    Report Status PENDING  Incomplete  Urine culture     Status: None   Collection Time: 10/05/18  3:10 PM  Result Value Ref Range Status   Specimen Description URINE, CLEAN CATCH  Final   Special Requests NONE  Final   Culture   Final    NO GROWTH Performed at [facility], [place] [address], [place], [place] [address]  Report Status 10/06/2018 FINAL  Final  CSF culture     Status: None (Preliminary result)   Collection Time: 10/06/18  6:16 PM  Result Value Ref Range Status   Specimen Description CSF  Final   Special Requests TUBE 2  Final   Gram Stain   Final    WBC PRESENT, PREDOMINANTLY MONONUCLEAR NO ORGANISMS SEEN CYTOSPIN SMEAR    Culture   Final    NO GROWTH 3 DAYS Performed at [facility], [place] [address], [place], [place] [address]    Report Status PENDING  Incomplete     Labs: BNP (last 3 results) No results for input(s): BNP in the last 8760 hours. Basic Metabolic Panel: Recent Labs  Lab  10/05/18 1447 10/08/18 0732 10/09/18 0611  NA 137 135 137  K 4.2 4.1 3.7  CL 104 102 104  CO2 25 24 25   GLUCOSE 115* 172* 167*  BUN 14 17 14   CREATININE 1.02 1.21 1.13  CALCIUM 9.4 8.5* 8.8*   Liver Function Tests: Recent Labs  Lab 10/05/18 1447  AST 21  ALT 23  ALKPHOS 74  BILITOT 0.6  PROT 7.1  ALBUMIN 3.8   Recent Labs  Lab 10/05/18 1447  LIPASE 35   Recent Labs  Lab 10/05/18 1504  AMMONIA 24   CBC: Recent Labs  Lab 10/05/18 1447 10/06/18 0520 10/08/18 0732 10/09/18 0611  WBC 8.3 11.4* 9.9 9.1  NEUTROABS 5.1  --   --   --   HGB 15.2 14.1 14.3 13.6  HCT 45.8 41.0 41.3 40.7  MCV 90.0 86.9 87.1 86.8  PLT 337 316 265 289   Cardiac Enzymes: No results for input(s): CKTOTAL, CKMB, CKMBINDEX, TROPONINI in the last 168 hours. BNP: Invalid input(s): POCBNP CBG: Recent Labs  Lab 10/08/18 0632 10/08/18 1136 10/08/18 1819 10/09/18 0645 10/09/18 1240  GLUCAP 149* 155* 114* 165* 126*   D-Dimer No results for input(s): DDIMER in the last 72 hours. Hgb A1c No results for input(s): HGBA1C in the last 72 hours. Lipid Profile No results for input(s): CHOL, HDL, LDLCALC, TRIG, CHOLHDL, LDLDIRECT in the last 72 hours. Thyroid function studies No results for input(s): TSH, T4TOTAL, T3FREE, THYROIDAB in the last 72 hours.  Invalid input(s): FREET3 Anemia work up No results for input(s): VITAMINB12, FOLATE, FERRITIN, TIBC, IRON, RETICCTPCT in the last 72 hours. Urinalysis    Component Value Date/Time   COLORURINE YELLOW 10/05/2018 1510   APPEARANCEUR CLEAR 10/05/2018 1510   LABSPEC 1.019 10/05/2018 1510   PHURINE 5.0 10/05/2018 1510   GLUCOSEU NEGATIVE 10/05/2018 1510   HGBUR NEGATIVE 10/05/2018 [place] 10/05/2018 [place] 10/05/2018 1510   PROTEINUR 30 (A) 10/05/2018 1510   NITRITE NEGATIVE 10/05/2018 1510   LEUKOCYTESUR NEGATIVE 10/05/2018 1510   Sepsis Labs Invalid input(s): PROCALCITONIN,  WBC,   LACTICIDVEN Microbiology Recent Results (from the past 240 hour(s))  Blood culture (routine x 2)     Status: None (Preliminary result)   Collection Time: 10/05/18  2:30 PM  Result Value Ref Range Status   Specimen Description BLOOD LEFT ANTECUBITAL  Final   Special Requests   Final    BOTTLES DRAWN AEROBIC AND ANAEROBIC Blood Culture results may not be optimal due to an inadequate volume of blood received in culture bottles   Culture   Final    NO GROWTH 4 DAYS Performed at [facility], [place] [address] [place], [place] [address]    Report Status PENDING  Incomplete  Blood culture (routine x 2)     Status: None (Preliminary result)   Collection Time: 10/05/18  3:05 PM  Result Value Ref Range Status   Specimen Description BLOOD RIGHT HAND  Final   Special Requests   Final    BOTTLES DRAWN AEROBIC ONLY Blood Culture adequate volume   Culture   Final    NO GROWTH 4 DAYS Performed at [facility], [address] [place], [place] [address]    Report Status PENDING  Incomplete  Urine culture     Status: None   Collection Time: 10/05/18  3:10 PM  Result Value Ref Range Status   Specimen Description URINE, CLEAN CATCH  Final   Special Requests NONE  Final   Culture   Final    NO GROWTH Performed at [facility], [place] [address] [place], [place] [address]    Report Status 10/06/2018 FINAL  Final  CSF culture     Status: None (Preliminary result)   Collection Time: 10/06/18  6:16 PM  Result Value Ref Range Status   Specimen Description CSF  Final   Special Requests TUBE 2  Final   Gram Stain   Final    WBC PRESENT, PREDOMINANTLY MONONUCLEAR NO ORGANISMS SEEN CYTOSPIN SMEAR    Culture   Final    NO GROWTH 3 DAYS Performed at [facility], [place] [address], [place], [place] [address]    Report Status PENDING  Incomplete     Time coordinating discharge: 50 minutes The [place] controlled substances registry was reviewed for this patient prior to filling the  <5 days supply controlled substances script.      SIGNED:   Edwin Dada, MD  [facility] 10/09/2018, 5:17 PM

## 2018-10-09 NOTE — Plan of Care (Signed)
Adequate for discharge.

## 2018-10-09 NOTE — Progress Notes (Deleted)
PHARMACY CONSULT NOTE FOR:  OUTPATIENT  PARENTERAL ANTIBIOTIC THERAPY (OPAT)  Indication: Meningitis Regimen: Ceftriaxone 2gm IV q12h End date: 10/16/18  IV antibiotic discharge orders are pended. To discharging provider:  please sign these orders via discharge navigator,  Select New Orders & click on the button choice - Manage This Unsigned Work.     Wille Aubuchon A. Levada Dy, PharmD, [place] Pager: [phone] Please utilize Amion for appropriate phone number to reach the unit pharmacist ([place])   10/09/2018, 11:08 AM

## 2018-10-09 NOTE — Progress Notes (Signed)
Physical Therapy Treatment Patient Details Name: [name] MRN: [id number] DOB: Feb 17, 1951 Today's Date: 10/09/2018    History of Present Illness Patient is a 68 y/o male who presents with confusion, speech difficulties and balance deficits. Recently treated for sinus infection. LP puncture results pending. 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 MRI and head CT-unremarkable. Admitted with questionable bacterial meningitis vs. Viral meningitis/encephalitis. PMH includes HTN, HLD, DM, hearing loss.     PT Comments    Pt much improved from yesterday both cognitively and functionally. Pt with smile and good spirits and able to carry on conversation without slurred speech. Pt does demonstrate continued R sided weakness, R UE shakiness requiring 2 hands to hold his coffee, and impaired balance. Pt with LOB every time he turned to the R requiring minA to maintain balance.  Pt to benefit from outpt PT to address R sided weakness and balance deficits. Acute PT to cont to follow.  Follow Up Recommendations  Outpatient PT;Supervision/Assistance - 24 hour     Equipment Recommendations  None recommended by PT    Recommendations for Other Services       Precautions / Restrictions Precautions Precautions: Fall Restrictions Weight Bearing Restrictions: No    Mobility  Bed Mobility Overal bed mobility: Modified Independent Bed Mobility: Supine to Sit           General bed mobility comments: HOB flat, no physical assist needed  Transfers Overall transfer level: Needs assistance Equipment used: None Transfers: Sit to/from Stand Sit to Stand: Supervision         General transfer comment: pt more steady today  Ambulation/Gait Ambulation/Gait assistance: Min guard Gait Distance (Feet): 400 Feet Assistive device: None Gait Pattern/deviations: Step-through pattern;Decreased stride length Gait velocity: verying speeds   General Gait Details: see DGI test in balance section. Pt more steady with  improved gait pattern with exception of turning to the R, pt with LOB every time he turned Right   Stairs Stairs: Yes Stairs assistance: Min assist Stair Management: One rail Right;Alternating pattern Number of Stairs: 12(x2 sets) General stair comments: pt with difficulty with R LE descending requiring pt to slow down    Wheelchair Mobility    Modified Rankin (Stroke Patients Only) Modified Rankin (Stroke Patients Only) Pre-Morbid Rankin Score: No symptoms Modified Rankin: Moderate disability     Balance           Standing balance support: During functional activity Standing balance-Leahy Scale: Fair Standing balance comment: pt able to wash glasses, face, and head at sink without LOB                 Standardized Balance Assessment Standardized Balance Assessment : Dynamic Gait Index   Dynamic Gait Index Level Surface: Mild Impairment Change in Gait Speed: Mild Impairment Gait with Horizontal Head Turns: Mild Impairment Gait with Vertical Head Turns: Mild Impairment Gait and Pivot Turn: Mild Impairment Step Over Obstacle: Mild Impairment Step Around Obstacles: Mild Impairment Steps: Mild Impairment Total Score: 16      Cognition Arousal/Alertness: Awake/alert Behavior During Therapy: WFL for tasks assessed/performed Overall Cognitive Status: Impaired/Different from baseline Area of Impairment: Memory                     Memory: (does not recall ICU stay) Following Commands: Follows multi-step commands with increased time;Follows multi-step commands inconsistently Safety/Judgement: (pt aware his balance is off)   Problem Solving: Slow processing General Comments: pt significantly improved from yesterday. Pt initiated washing face, head  and glasses. with set up pt able to put splenda and creamer in his coffee sitting EOB.       Exercises      General Comments General comments (skin integrity, edema, etc.): upon completing stair negotiation  and amb, pt sat down and was drinking coffee and had sudden onset of vomitting. RN notified, pt slightly diaphoretic, BP 133/88, pt denies feeling ill, RN notified and came to assess pt      Pertinent Vitals/Pain Pain Assessment: No/denies pain(denies headache) Pain Score: 0-No pain(no headache)    Home Living                      Prior Function            PT Goals (current goals can now be found in the care plan section) Acute Rehab PT Goals Patient Stated Goal: go home Progress towards PT goals: Progressing toward goals    Frequency    Min 3X/week      PT Plan Discharge plan needs to be updated    Co-evaluation              AM-PAC PT "6 Clicks" Mobility   Outcome Measure  Help needed turning from your back to your side while in a flat bed without using bedrails?: A Little Help needed moving from lying on your back to sitting on the side of a flat bed without using bedrails?: A Little Help needed moving to and from a bed to a chair (including a wheelchair)?: A Little Help needed standing up from a chair using your arms (e.g., wheelchair or bedside chair)?: A Little Help needed to walk in hospital room?: A Little Help needed climbing 3-5 steps with a railing? : A Little 6 Click Score: 18    End of Session Equipment Utilized During Treatment: Gait belt Activity Tolerance: Patient tolerated treatment well Patient left: in chair;with call bell/phone within reach;with chair alarm set;with family/visitor present Nurse Communication: Mobility status PT Visit Diagnosis: Unsteadiness on feet (R26.81);Difficulty in walking, not elsewhere classified (R26.2)     Time: 0701-0802 PT Time Calculation (min) (ACUTE ONLY): 61 min  Charges:  $Gait Training: 23-37 mins $Therapeutic Activity: 8-22 mins $Neuromuscular Re-education: 8-22 mins                     Kittie Plater, PT, DPT Acute Rehabilitation Services Pager #: [phone] Office #:  [phone]    Berline Lopes 10/09/2018, 11:30 AM

## 2018-10-09 NOTE — Progress Notes (Signed)
[facility] Infusion Coordinator will follow pt to support home infusion pharmacy services at [place] for home IV ABX,  If patient discharges after hours, please call [phone].   Larry Sierras 10/09/2018, 12:34 PM

## 2018-10-09 NOTE — Progress Notes (Signed)
Physical medicine rehabilitation consult requested chart reviewed. Patient with excellent overall gains follow-up per physical therapy today ambulating 400 feet minimal guard without assistive device.Recommendations at this time are for outpatient therapies.Hold on formal rehabilitation consult at this time with recommendations of discharge to home

## 2018-10-10 LAB — CULTURE, BLOOD (ROUTINE X 2)
CULTURE: NO GROWTH
Culture: NO GROWTH
SPECIAL REQUESTS: ADEQUATE

## 2018-10-10 LAB — CSF CULTURE W GRAM STAIN

## 2018-10-10 LAB — CSF CULTURE: CULTURE: NO GROWTH

## 2018-10-11 ENCOUNTER — Emergency Department (HOSPITAL_COMMUNITY)
Admission: EM | Admit: 2018-10-11 | Discharge: 2018-10-11 | Disposition: A | Discharge status: Home / Self Care | Payer: 59 | Attending: Emergency Medicine | Admitting: Emergency Medicine

## 2018-10-11 ENCOUNTER — Emergency Department (HOSPITAL_COMMUNITY): Payer: 59

## 2018-10-11 ENCOUNTER — Encounter (HOSPITAL_COMMUNITY): Payer: Self-pay

## 2018-10-11 ENCOUNTER — Other Ambulatory Visit: Payer: Self-pay

## 2018-10-11 DIAGNOSIS — Z79899 Other long term (current) drug therapy: Secondary | ICD-10-CM | POA: Insufficient documentation

## 2018-10-11 DIAGNOSIS — R630 Anorexia: Secondary | ICD-10-CM | POA: Diagnosis not present

## 2018-10-11 DIAGNOSIS — Z7984 Long term (current) use of oral hypoglycemic drugs: Secondary | ICD-10-CM | POA: Insufficient documentation

## 2018-10-11 DIAGNOSIS — Z959 Presence of cardiac and vascular implant and graft, unspecified: Secondary | ICD-10-CM | POA: Diagnosis not present

## 2018-10-11 DIAGNOSIS — E119 Type 2 diabetes mellitus without complications: Secondary | ICD-10-CM | POA: Insufficient documentation

## 2018-10-11 DIAGNOSIS — I10 Essential (primary) hypertension: Secondary | ICD-10-CM | POA: Insufficient documentation

## 2018-10-11 DIAGNOSIS — R0789 Other chest pain: Secondary | ICD-10-CM | POA: Insufficient documentation

## 2018-10-11 DIAGNOSIS — Z8673 Personal history of transient ischemic attack (TIA), and cerebral infarction without residual deficits: Secondary | ICD-10-CM | POA: Diagnosis not present

## 2018-10-11 DIAGNOSIS — R072 Precordial pain: Secondary | ICD-10-CM

## 2018-10-11 DIAGNOSIS — R110 Nausea: Secondary | ICD-10-CM | POA: Diagnosis not present

## 2018-10-11 LAB — BASIC METABOLIC PANEL
Anion gap: 13 (ref 5–15)
BUN: 13 mg/dL (ref 8–23)
CO2: 25 mmol/L (ref 22–32)
Calcium: 9.3 mg/dL (ref 8.9–10.3)
Chloride: 100 mmol/L (ref 98–111)
Creatinine, Ser: 1.12 mg/dL (ref 0.61–1.24)
GFR calc Af Amer: >60 mL/min (ref >60)
GFR calc non Af Amer: >60 mL/min (ref >60)
Glucose, Bld: 150 mg/dL — ABNORMAL HIGH (ref 70–99)
POTASSIUM: 3.9 mmol/L (ref 3.5–5.1)
Sodium: 138 mmol/L (ref 135–145)

## 2018-10-11 LAB — CBC
HCT: 39.9 % (ref 39.0–52.0)
HEMOGLOBIN: 12.9 g/dL — AB (ref 13.0–17.0)
MCH: 28.9 pg (ref 26.0–34.0)
MCHC: 32.3 g/dL (ref 30.0–36.0)
MCV: 89.3 fL (ref 80.0–100.0)
Platelets: 314 10*3/uL (ref 150–400)
RBC: 4.47 MIL/uL (ref 4.22–5.81)
RDW: 13 % (ref 11.5–15.5)
WBC: 7.9 10*3/uL (ref 4.0–10.5)
nRBC: 0 % (ref 0.0–0.2)

## 2018-10-11 LAB — I-STAT TROPONIN, ED
TROPONIN I, POC: 0.01 ng/mL (ref 0.00–0.08)
Troponin i, poc: 0.01 ng/mL (ref 0.00–0.08)

## 2018-10-11 LAB — D-DIMER, QUANTITATIVE: D-Dimer, Quant: 0.85 ug/mL-FEU — ABNORMAL HIGH (ref 0.00–0.50)

## 2018-10-11 MED ORDER — SODIUM CHLORIDE 0.9 % IV SOLN
2.0000 g | Freq: Once | INTRAVENOUS | Status: AC
  Administered 2018-10-11: 2 g via INTRAVENOUS
  Filled 2018-10-11: qty 20

## 2018-10-11 MED ORDER — IOPAMIDOL (ISOVUE-370) INJECTION 76%
INTRAVENOUS | Status: AC
  Administered 2018-10-11: 13:00:00
  Filled 2018-10-11: qty 100

## 2018-10-11 NOTE — ED Notes (Signed)
Got patient vitals patient is resting with call bell in reach 

## 2018-10-11 NOTE — ED Notes (Signed)
Pt recently seen here and diagnosed for bacterial meningitis 10/05/2017, pt is receiving schedule dose of 2 g Rocephin BID. Needs his morning dose now.  Pt uses hearing aids and reports decrease in hearing to the Right side since recent diagnosis

## 2018-10-11 NOTE — ED Notes (Signed)
Pt dc'd home with all belongings, driven home by spouse 

## 2018-10-11 NOTE — ED Triage Notes (Signed)
Pt presents to ED from home with generalized chest tightness x1 day, vomited x1 yesterday. CP 0/10 after 1 SL nitro en route. 18 g RAC

## 2018-10-11 NOTE — Discharge Instructions (Addendum)
It was our pleasure to provide your ER care today - we hope that you feel better.  Follow up with your doctor/cardiologist in the next few days - call office to arrange follow up visit. Discuss possible stress test and/or further testing then.   Your CT scan was read as showing no pulmonary embolism - incidental note was made of numerous liver cysts (this was also noted on a prior ultrasound) - follow up with your doctor.   Return to ER right away if worse, new symptoms, persistent/recurrent chest pain, increased trouble breathing, high fevers, weak/fainting, other concern.

## 2018-10-11 NOTE — ED Provider Notes (Signed)
[facility] EMERGENCY DEPARTMENT Provider Note   CSN: [id number] Arrival date & time: 10/11/18  9381     History   Chief Complaint Chief Complaint  Patient presents with  . Chest Pain    HPI [name] is a 68 y.o. male.  Patient s/p recent admission for viral meningitis, presents with mid chest discomfort since last evening. Symptoms mild-mod, occurred at rest, was constant, dull. Had some mild discomfort through to this AM. No current cp or discomfort. No associated sob, nv or diaphoresis, although states did have some nausea/decreased appetite since hospital discharge 2 days ago. Denies leg pain or swelling. Earlier symptoms/headache is markedly improved from prior. Denies cough or uri symptoms. No fever or chills. No hx cad. No hx dvt or pe.   The history is provided by the patient, a relative and the spouse.  Chest Pain  Associated symptoms: nausea   Associated symptoms: no abdominal pain, no back pain, no cough, no fever, no headache, no shortness of breath and no vomiting     Past Medical History:  Diagnosis Date  . Decreased hearing   . Diabetes mellitus without complication ([place])   . History of adenomatous polyp of colon   . Hyperlipidemia   . Hypertension   . Lumbar spondylosis   . Obesity   . Vitamin D deficiency     Patient Active Problem List   Diagnosis Date Noted  . Delirium 10/06/2018  . Sinusitis 10/06/2018  . 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 Abnormal ECG 12/24/2014  . DM (diabetes mellitus), type 2 with renal complications ([place]) 01/75/1025  . Hyperlipidemia 12/24/2014  . Jaw pain 08/08/2011  . Cardiovascular risk factor 08/08/2011    Past Surgical History:  Procedure Laterality Date  . BACK SURGERY    . COLONOSCOPY  07/21/2004, 04/03/2010  . EYE MUSCLE REPAIR    . [facility]  . LUMBAR FUSION  04/17/2009   L4-L5  . LUMBAR FUSION  1992   L4-L5        Home Medications    Prior to Admission medications     Medication Sig Start Date End Date Taking? Authorizing Provider  acetaminophen (TYLENOL) 500 MG tablet Take 1,000 mg by mouth 2 (two) times daily as needed for mild pain.    [provider]  azelastine (ASTELIN) 0.1 % nasal spray Place 1 spray into both nostrils 2 (two) times daily. Use in each nostril as directed    [provider]  cefTRIAXone (ROCEPHIN) IVPB Inject 2 g into the vein every 12 (twelve) hours for 7 days. Indication:  Meningitis Last Day of Therapy:  10/16/18 Labs - Once weekly:  CBC/D and BMP, Labs - Every other week:  ESR and CRP 10/09/18 10/16/18  Danford, Suann Larry, MD  cholecalciferol (VITAMIN D) 1000 UNITS tablet Take 1,000 Units by mouth daily.      [provider]  diazepam (VALIUM) 5 MG tablet Take 5 mg by mouth at bedtime as needed for sleep. 02/20/17   [provider]  FLUoxetine (PROZAC) 10 MG capsule Take 1 capsule by mouth daily. 09/23/15   [provider]  fluticasone (FLONASE) 50 MCG/ACT nasal spray Place 1 spray into both nostrils daily.    [provider]  HYDROcodone-acetaminophen (NORCO/VICODIN) 5-325 MG tablet Place 1 tablet into feeding tube every 4 (four) hours as needed for moderate pain or severe pain (Do not give hydrocodone with his prn acetaminophen - give one or the other.). 10/09/18  Danford, Suann Larry, MD  HYDROcodone-homatropine (HYCODAN) 5-1.5 MG/5ML syrup Take 5 mLs by mouth every 6 (six) hours as needed for cough.    [provider]  ibuprofen (ADVIL,MOTRIN) 800 MG tablet Take 800 mg by mouth 3 (three) times daily as needed for moderate pain.    [provider]  metFORMIN (GLUCOPHAGE) 500 MG tablet Take 1 tablet by mouth daily.  09/14/15   [provider]  omeprazole (PRILOSEC) 20 MG capsule Take 20 mg by mouth daily.    [provider]  rosuvastatin (CRESTOR) 10 MG tablet Take 10 mg by mouth daily.    [provider]  S-Adenosylmethionine (SAM-E) 400  MG TABS Take 1 tablet by mouth daily.      [provider]    Family History Family History  Problem Relation Age of Onset  . Heart failure Mother   . Hypertension Mother   . Hypertension Father   . Breast cancer Sister   . Prostate cancer Brother   . Heart attack Brother 18  . Heart failure Maternal Grandmother   . Heart failure Maternal Grandfather   . Heart failure Paternal Grandmother   . Heart failure Paternal Grandfather     Social History Social History   Tobacco Use  . Smoking status: Never Smoker  . Smokeless tobacco: Never Used  Substance Use Topics  . Alcohol use: Yes    Comment: 2 to 3 x per week.  . Drug use: No     Allergies   Patient has no known allergies.   Review of Systems Review of Systems  Constitutional: Negative for fever.  HENT: Negative for sore throat.   Eyes: Negative for redness.  Respiratory: Negative for cough and shortness of breath.   Cardiovascular: Positive for chest pain. Negative for leg swelling.  Gastrointestinal: Positive for nausea. Negative for abdominal pain and vomiting.  Genitourinary: Negative for flank pain.  Musculoskeletal: Negative for back pain and neck pain.  Skin: Negative for rash.  Neurological: Negative for headaches.  Hematological: Does not bruise/bleed easily.  Psychiatric/Behavioral: Negative for confusion.     Physical Exam Updated Vital Signs BP (!) 128/98   Pulse 80   Temp 98.2 F (36.8 C) (Oral)   Resp 14   Ht 1.803 m (5' 11")   Wt 102 kg   SpO2 99%   BMI 31.36 kg/m   Physical Exam Vitals signs and nursing note reviewed.  Constitutional:      Appearance: Normal appearance. He is well-developed.  HENT:     Head: Atraumatic.     Nose: Nose normal.     Mouth/Throat:     Mouth: Mucous membranes are moist.     Pharynx: Oropharynx is clear.  Eyes:     General: No scleral icterus.    Conjunctiva/sclera: Conjunctivae normal.     Pupils: Pupils are equal, round, and reactive to  light.  Neck:     Musculoskeletal: Normal range of motion and neck supple. No neck rigidity.     Trachea: No tracheal deviation.  Cardiovascular:     Rate and Rhythm: Normal rate and regular rhythm.     Pulses: Normal pulses.     Heart sounds: Normal heart sounds. No murmur. No friction rub. No gallop.   Pulmonary:     Effort: Pulmonary effort is normal. No accessory muscle usage or respiratory distress.     Breath sounds: Normal breath sounds.  Chest:     Chest wall: No tenderness.  Abdominal:  General: Bowel sounds are normal. There is no distension.     Palpations: Abdomen is soft.     Tenderness: There is no abdominal tenderness. There is no guarding.  Genitourinary:    Comments: No cva tenderness. Musculoskeletal:        General: No swelling or tenderness.     Right lower leg: No edema.     Left lower leg: No edema.     Comments: PICC line RUE without swelling, tenderness, erythema or sign of infection.   Skin:    General: Skin is warm and dry.     Findings: No rash.  Neurological:     Mental Status: He is alert.     Comments: Alert, speech clear.   Psychiatric:        Mood and Affect: Mood normal.      ED Treatments / Results  Labs (all labs ordered are listed, but only abnormal results are displayed) Results for orders placed or performed during the hospital encounter of 16/10/96  Basic metabolic panel  Result Value Ref Range   Sodium 138 135 - 145 mmol/L   Potassium 3.9 3.5 - 5.1 mmol/L   Chloride 100 98 - 111 mmol/L   CO2 25 22 - 32 mmol/L   Glucose, Bld 150 (H) 70 - 99 mg/dL   BUN 13 8 - 23 mg/dL   Creatinine, Ser 1.12 0.61 - 1.24 mg/dL   Calcium 9.3 8.9 - 10.3 mg/dL   GFR calc non Af Amer >60 >60 mL/min   GFR calc Af Amer >60 >60 mL/min   Anion gap 13 5 - 15  CBC  Result Value Ref Range   WBC 7.9 4.0 - 10.5 K/uL   RBC 4.47 4.22 - 5.81 MIL/uL   Hemoglobin 12.9 (L) 13.0 - 17.0 g/dL   HCT 39.9 39.0 - 52.0 %   MCV 89.3 80.0 - 100.0 fL   MCH 28.9  26.0 - 34.0 pg   MCHC 32.3 30.0 - 36.0 g/dL   RDW 13.0 11.5 - 15.5 %   Platelets 314 150 - 400 K/uL   nRBC 0.0 0.0 - 0.2 %  D-dimer, quantitative (not at [facility])  Result Value Ref Range   D-Dimer, Quant 0.85 (H) 0.00 - 0.50 ug/mL-FEU  I-stat troponin, ED  Result Value Ref Range   Troponin i, poc 0.01 0.00 - 0.08 ng/mL   Comment 3          I-stat troponin, ED  Result Value Ref Range   Troponin i, poc 0.01 0.00 - 0.08 ng/mL   Comment 3           Ct Angio Head W Or Wo Contrast  Result Date: 10/05/2018 CLINICAL DATA:  Aphasia which began several hours ago. EXAM: CT ANGIOGRAPHY HEAD AND NECK TECHNIQUE: Multidetector CT imaging of the head and neck was performed using the standard protocol during bolus administration of intravenous contrast. Multiplanar CT image reconstructions and MIPs were obtained to evaluate the vascular anatomy. Carotid stenosis measurements (when applicable) are obtained utilizing NASCET criteria, using the distal internal carotid diameter as the denominator. CONTRAST:  43m ISOVUE-370 IOPAMIDOL (ISOVUE-370) INJECTION 76% COMPARISON:  CT head earlier today. FINDINGS: There is significant motion degradation. Small or subtle abnormalities could be overlooked. CTA NECK FINDINGS Aortic arch: Standard branching. Imaged portion shows no evidence of aneurysm or dissection. No significant stenosis of the major arch vessel origins. Right carotid system: Patency is established. Presence or absence of flow reducing lesion at the bifurcation difficult to  confirm. Left carotid system: Patency is established. Presence or absence of a flow reducing lesion at the bifurcation difficult to confirm. Vertebral arteries: RIGHT vertebral is the dominant contributor. LEFT vertebral is diminutive. Assessment of ostial narrowing or disease in the neck is difficult to confirm. Skeleton: Spondylosis. Other neck: No gross mass lesion. Upper chest: No visible pneumothorax. Review of the MIP images confirms the  above findings CTA HEAD FINDINGS Anterior circulation: No large vessel occlusion. The distal ICA, proximal ACA, and proximal MCA segments appear patent. Posterior circulation: No large vessel occlusion. RIGHT vertebral dominant contributor to the basilar. Venous sinuses: Grossly patent. Anatomic variants: Not established. Delayed phase: Not performed. Review of the MIP images confirms the above findings IMPRESSION: 1. Severely motion degraded examination. No extracranial or intracranial flow reducing lesion is observed. Patency of the extracranial and intracranial circulation is established. 2. These results were discussed directly at the time of interpretation on 10/05/2018 at 4:45 pm to Dr. Roland Rack , who verbally acknowledged these results. Electronically Signed   By: Staci Righter M.D.   On: 10/05/2018 17:04   Dg Chest 2 View  Result Date: 10/05/2018 CLINICAL DATA:  Acute onset altered mental status today. EXAM: CHEST - 2 VIEW COMPARISON:  None. FINDINGS: The lungs are clear. Elevation of the right hemidiaphragm relative to the left noted. Heart size is normal. No pneumothorax or pleural fluid. No acute or focal bony abnormality. IMPRESSION: No acute disease. Electronically Signed   By: Inge Rise M.D.   On: 10/05/2018 16:12   Ct Head Wo Contrast  Result Date: 10/06/2018 CLINICAL DATA:  Neuro deficit(s), subacute neuro change, patient less responsive, more sleepy, running fever (100.1F), confused. EXAM: CT HEAD WITHOUT CONTRAST TECHNIQUE: Contiguous axial images were obtained from the base of the skull through the vertex without intravenous contrast. COMPARISON:  10/05/2018 CT head and CTA head. FINDINGS: Brain: No evidence of acute infarction, hemorrhage, hydrocephalus, extra-axial collection or mass lesion/mass effect. Stable chronic microvascular ischemic changes and volume loss of the brain. Vascular: Persistent contrast enhancement of the intracranial vessels. Skull: Normal. Negative  for fracture or focal lesion. Sinuses/Orbits: Diffuse paranasal sinus mucosal thickening and fluid levels within the maxillary sinuses. Normal aeration of mastoid air cells. Orbits are unremarkable. Other: None. IMPRESSION: 1. No acute intracranial abnormality identified. 2. Stable chronic microvascular ischemic changes and volume loss of the brain. 3. Sinus disease with maxillary fluid levels which may represent acute sinusitis. Electronically Signed   By: Kristine Garbe M.D.   On: 10/06/2018 01:18   Ct Head Wo Contrast  Result Date: 10/05/2018 CLINICAL DATA:  Headache and left-sided facial pain for 1 week. EXAM: CT HEAD WITHOUT CONTRAST CT MAXILLOFACIAL WITHOUT CONTRAST TECHNIQUE: Multidetector CT imaging of the head and maxillofacial structures were performed using the standard protocol without intravenous contrast. Multiplanar CT image reconstructions of the maxillofacial structures were also generated. COMPARISON:  None. FINDINGS: CT HEAD FINDINGS Brain: Mild diffuse cortical atrophy is noted. Mild chronic ischemic white matter disease is noted. No mass effect or midline shift is noted. Ventricular size is within normal limits. There is no evidence of mass lesion, hemorrhage or acute infarction. Vascular: No hyperdense vessel or unexpected calcification. Skull: Normal. Negative for fracture or focal lesion. Other: None. CT MAXILLOFACIAL FINDINGS Osseous: No fracture or mandibular dislocation. No destructive process. Orbits: Negative. No traumatic or inflammatory finding. Sinuses: Bilateral maxillary sinusitis is noted with air-fluid levels. Soft tissues: Negative. IMPRESSION: Mild diffuse cortical atrophy. Mild chronic ischemic white matter disease. No acute  intracranial abnormality seen. Bilateral maxillary sinusitis is noted. Electronically Signed   By: Marijo Conception, M.D.   On: 10/05/2018 15:50   Ct Angio Neck W Or Wo Contrast  Result Date: 10/05/2018 CLINICAL DATA:  Aphasia which began  several hours ago. EXAM: CT ANGIOGRAPHY HEAD AND NECK TECHNIQUE: Multidetector CT imaging of the head and neck was performed using the standard protocol during bolus administration of intravenous contrast. Multiplanar CT image reconstructions and MIPs were obtained to evaluate the vascular anatomy. Carotid stenosis measurements (when applicable) are obtained utilizing NASCET criteria, using the distal internal carotid diameter as the denominator. CONTRAST:  33m ISOVUE-370 IOPAMIDOL (ISOVUE-370) INJECTION 76% COMPARISON:  CT head earlier today. FINDINGS: There is significant motion degradation. Small or subtle abnormalities could be overlooked. CTA NECK FINDINGS Aortic arch: Standard branching. Imaged portion shows no evidence of aneurysm or dissection. No significant stenosis of the major arch vessel origins. Right carotid system: Patency is established. Presence or absence of flow reducing lesion at the bifurcation difficult to confirm. Left carotid system: Patency is established. Presence or absence of a flow reducing lesion at the bifurcation difficult to confirm. Vertebral arteries: RIGHT vertebral is the dominant contributor. LEFT vertebral is diminutive. Assessment of ostial narrowing or disease in the neck is difficult to confirm. Skeleton: Spondylosis. Other neck: No gross mass lesion. Upper chest: No visible pneumothorax. Review of the MIP images confirms the above findings CTA HEAD FINDINGS Anterior circulation: No large vessel occlusion. The distal ICA, proximal ACA, and proximal MCA segments appear patent. Posterior circulation: No large vessel occlusion. RIGHT vertebral dominant contributor to the basilar. Venous sinuses: Grossly patent. Anatomic variants: Not established. Delayed phase: Not performed. Review of the MIP images confirms the above findings IMPRESSION: 1. Severely motion degraded examination. No extracranial or intracranial flow reducing lesion is observed. Patency of the extracranial and  intracranial circulation is established. 2. These results were discussed directly at the time of interpretation on 10/05/2018 at 4:45 pm to Dr. MRoland Rack, who verbally acknowledged these results. Electronically Signed   By: JStaci RighterM.D.   On: 10/05/2018 17:04   Mr MJodene NamNeck W Wo Contrast  Result Date: 10/06/2018 CLINICAL DATA:  Patient became confused after administration of high-dose steroids. EXAM: MRI HEAD WITHOUT CONTRAST MRA NECK WITHOUT AND WITH CONTRAST TECHNIQUE: Multiplanar, multiecho pulse sequences of the brain and surrounding structures were obtained without intravenous contrast. Angiographic images of the neck were obtained using MRA technique with and without intravenous contrast. Carotid stenosis measurements (when applicable) are obtained utilizing NASCET criteria, using the distal internal carotid diameter as the denominator. CONTRAST:  Gadavist 10 mL. COMPARISON:  CT head without contrast, CT maxillofacial without contrast, CTA head neck with contrast performed 10/05/2018. FINDINGS: MRI HEAD FINDINGS Brain: No acute stroke, hemorrhage, mass lesion, hydrocephalus, or extra-axial fluid. Mild atrophy. Mild subcortical and periventricular T2 and FLAIR hyperintensities, likely chronic microvascular ischemic change. Prominent ventricles, likely central atrophy. Vascular: Patency of intracranial vasculature is established based on flow void. Skull and upper cervical spine: No acute findings. Sinuses/Orbits: Significant paranasal sinus disease, layering BILATERAL maxillary fluid, also mucosal thickening in the ethmoid, sphenoid, and frontal sinuses. Negative orbits. Other: None. MRA NECK FINDINGS Conventional branching of the great vessels from the arch. No proximal stenosis. Carotid bifurcations are free of disease. There is moderate dolichoectasia of the cervical ICA segments. No evidence of carotid stenosis or fibromuscular dysplasia. No carotid dissection. RIGHT vertebral is dominant  but both vertebrals are patent. Rudimentary connection of the  LEFT vertebral to the basilar. RIGHT vertebral is widely patent throughout its vertebral segments and is the dominant contributor to the basilar. No dissection or ostial narrowing. IMPRESSION: MRI brain demonstrates no acute or focal abnormality. Specifically no evidence for acute stroke. Chronic and acute sinusitis. No extracranial stenosis or dissection.  Moderate dolichoectasia. Electronically Signed   By: Staci Righter M.D.   On: 10/06/2018 14:18   Mr Brain Wo Contrast (neuro Protocol)  Result Date: 10/06/2018 CLINICAL DATA:  Patient became confused after administration of high-dose steroids. EXAM: MRI HEAD WITHOUT CONTRAST MRA NECK WITHOUT AND WITH CONTRAST TECHNIQUE: Multiplanar, multiecho pulse sequences of the brain and surrounding structures were obtained without intravenous contrast. Angiographic images of the neck were obtained using MRA technique with and without intravenous contrast. Carotid stenosis measurements (when applicable) are obtained utilizing NASCET criteria, using the distal internal carotid diameter as the denominator. CONTRAST:  Gadavist 10 mL. COMPARISON:  CT head without contrast, CT maxillofacial without contrast, CTA head neck with contrast performed 10/05/2018. FINDINGS: MRI HEAD FINDINGS Brain: No acute stroke, hemorrhage, mass lesion, hydrocephalus, or extra-axial fluid. Mild atrophy. Mild subcortical and periventricular T2 and FLAIR hyperintensities, likely chronic microvascular ischemic change. Prominent ventricles, likely central atrophy. Vascular: Patency of intracranial vasculature is established based on flow void. Skull and upper cervical spine: No acute findings. Sinuses/Orbits: Significant paranasal sinus disease, layering BILATERAL maxillary fluid, also mucosal thickening in the ethmoid, sphenoid, and frontal sinuses. Negative orbits. Other: None. MRA NECK FINDINGS Conventional branching of the great vessels  from the arch. No proximal stenosis. Carotid bifurcations are free of disease. There is moderate dolichoectasia of the cervical ICA segments. No evidence of carotid stenosis or fibromuscular dysplasia. No carotid dissection. RIGHT vertebral is dominant but both vertebrals are patent. Rudimentary connection of the LEFT vertebral to the basilar. RIGHT vertebral is widely patent throughout its vertebral segments and is the dominant contributor to the basilar. No dissection or ostial narrowing. IMPRESSION: MRI brain demonstrates no acute or focal abnormality. Specifically no evidence for acute stroke. Chronic and acute sinusitis. No extracranial stenosis or dissection.  Moderate dolichoectasia. Electronically Signed   By: Staci Righter M.D.   On: 10/06/2018 14:18   Mr Mrv Head Wo Cm  Result Date: 10/08/2018 CLINICAL DATA:  68 y/o M; bacterial meningitis seated from otitis media. EXAM: MR VENOGRAM the HEAD WITHOUT CONTRAST TECHNIQUE: Angiographic images of the intracranial venous structures were obtained using MRV technique without intravenous contrast. COMPARISON:  10/05/2018 CTA head. FINDINGS: Patent superior sagittal sinus, bilateral transverse sinus, bilateral sigmoid sinus, bilateral upper internal jugular veins, the straight sinus, bilateral internal cerebral veins, and the basal veins of [place]. Anatomic variant asymmetry in transverse sinus drainage systems, right larger than left. IMPRESSION: Patent dural venous sinuses.  No evidence of sinus thrombosis. Electronically Signed   By: Kristine Garbe M.D.   On: 10/08/2018 18:07   Ct Head Code Stroke Wo Contrast  Result Date: 10/05/2018 CLINICAL DATA:  Code stroke. Imbalance and confusion. Headache and facial pain. EXAM: CT HEAD WITHOUT CONTRAST TECHNIQUE: Contiguous axial images were obtained from the base of the skull through the vertex without intravenous contrast. COMPARISON:  Head CT 10/05/2018 at 1520 hours FINDINGS: Brain: No definite  acute infarct, intracranial hemorrhage, mass, midline shift, or extra-axial fluid collection is identified within limitations of mild to slightly moderate motion artifact. Slight asymmetric hypoattenuation in the central portion of the right cerebellar hemispheres favored to be artifactual. Mild cerebral atrophy is again noted. Cerebral white matter hypodensities are  nonspecific but compatible with mild chronic small vessel ischemic disease. Vascular: Mild calcified atherosclerosis at the skull base. No hyperdense vessel. Skull: No fracture or focal osseous lesion. Sinuses/Orbits: Prominent bilateral maxillary sinus fluid levels, unchanged. Mild bilateral ethmoid air cell mucosal thickening. Clear mastoid air cells. Unremarkable orbits. Other: None. ASPECTS [facility] Stroke Program Early CT Score) Not scored with this history. IMPRESSION: 1. No evidence of acute intracranial abnormality within limitations of motion artifact. 2. Mild chronic small vessel ischemic disease and cerebral atrophy. These results were communicated to Dr. Leonel Ramsay at 4:24 pm on 10/05/2018 by text page via the [facility] messaging system. Electronically Signed   By: Logan Bores M.D.   On: 10/05/2018 16:24   [place] Ekg Site Rite  Result Date: 10/09/2018 If Site Rite image not attached, placement could not be confirmed due to current cardiac rhythm.  Ct Maxillofacial Wo Contrast  Result Date: 10/05/2018 CLINICAL DATA:  Headache and left-sided facial pain for 1 week. EXAM: CT HEAD WITHOUT CONTRAST CT MAXILLOFACIAL WITHOUT CONTRAST TECHNIQUE: Multidetector CT imaging of the head and maxillofacial structures were performed using the standard protocol without intravenous contrast. Multiplanar CT image reconstructions of the maxillofacial structures were also generated. COMPARISON:  None. FINDINGS: CT HEAD FINDINGS Brain: Mild diffuse cortical atrophy is noted. Mild chronic ischemic white matter disease is noted. No mass effect or midline shift is  noted. Ventricular size is within normal limits. There is no evidence of mass lesion, hemorrhage or acute infarction. Vascular: No hyperdense vessel or unexpected calcification. Skull: Normal. Negative for fracture or focal lesion. Other: None. CT MAXILLOFACIAL FINDINGS Osseous: No fracture or mandibular dislocation. No destructive process. Orbits: Negative. No traumatic or inflammatory finding. Sinuses: Bilateral maxillary sinusitis is noted with air-fluid levels. Soft tissues: Negative. IMPRESSION: Mild diffuse cortical atrophy. Mild chronic ischemic white matter disease. No acute intracranial abnormality seen. Bilateral maxillary sinusitis is noted. Electronically Signed   By: Marijo Conception, M.D.   On: 10/05/2018 15:50   Dg Fluoro Guide Lumbar Puncture  Result Date: 10/06/2018 CLINICAL DATA:  68 year old male for lumbar puncture. Fever of unknown origin. EXAM: DIAGNOSTIC LUMBAR PUNCTURE UNDER FLUOROSCOPIC GUIDANCE FLUOROSCOPY TIME:  Fluoroscopy Time:  12 seconds Number of Acquired Spot Images: 1 PROCEDURE: Written informed consent was obtained from the patient's wife prior to the procedure, including potential complications of headache, allergy, and pain. With the patient prone, the lower back was prepped with Betadine. 1% Lidocaine was used for local anesthesia. Lumbar puncture was performed at the L3-4 level using a 20 gauge needle with return of clear colorless CSF with an opening pressure of 25 cm water. 10 ml of CSF were obtained for laboratory studies. The patient tolerated the procedure well and there were no apparent complications. IMPRESSION: Fluoroscopic guided lumbar puncture without immediate complication. 10 ml of clear colorless CSF obtained. Electronically Signed   By: Margarette Canada M.D.   On: 10/06/2018 18:45    EKG EKG Interpretation  Date/Time:  Thursday October 11 2018 08:55:56 EST Ventricular Rate:  86 PR Interval:    QRS Duration: 95 QT Interval:  384 QTC Calculation: 460 R  Axis:   -35 Text Interpretation:  Sinus rhythm Left axis deviation No significant change since last tracing Confirmed by Lajean Saver [phone]) on 10/11/2018 8:59:42 AM Also confirmed by Lajean Saver [phone]), editor Philomena Doheny [phone])  on 10/11/2018 9:46:11 AM   Radiology Dg Chest 2 View  Result Date: 10/11/2018 CLINICAL DATA:  Chest tightness.  History of meningitis. EXAM: CHEST - 2  VIEW COMPARISON:  10/05/2018. FINDINGS: PICC line noted with tip over superior vena cava. Mediastinum hilar structures normal. Mild right base atelectasis/scarring. Slight nodularity noted over the upper lungs is stable and consistent with prominent vascular markings. No pleural effusion or pneumothorax. No acute bony abnormality. IMPRESSION: 1.  PICC line noted with tip over superior vena cava. 2.  Mild right base atelectasis/infiltrate. Electronically Signed   By: Marcello Moores  Register   On: 10/11/2018 10:12   Ct Angio Chest Pe W/cm &/or Wo Cm  Result Date: 10/11/2018 CLINICAL DATA:  Generalized chest tightness for 1 day. Vomited x1 yesterday. EXAM: CT ANGIOGRAPHY CHEST WITH CONTRAST TECHNIQUE: Multidetector CT imaging of the chest was performed using the standard protocol during bolus administration of intravenous contrast. Multiplanar CT image reconstructions and MIPs were obtained to evaluate the vascular anatomy. CONTRAST:  75 mL ISOVUE-370 IOPAMIDOL (ISOVUE-370) INJECTION 76% COMPARISON:  Current chest radiograph. FINDINGS: Cardiovascular: There is satisfactory opacification of the pulmonary arteries to the segmental level. There is no evidence of a pulmonary embolism. Heart is normal in size and configuration. No pericardial effusion. Mild left coronary artery calcifications. Ascending aorta is mildly dilated to 4 cm. No aortic dissection. No atherosclerosis. Mediastinum/Nodes: No enlarged mediastinal, hilar, or axillary lymph nodes. Thyroid gland, trachea, and esophagus demonstrate no significant findings. Lungs/Pleura:  Elevated right hemidiaphragm. There is atelectasis in the right middle and lower lobes. Lungs otherwise clear. No pleural effusion or pneumothorax. Upper Abdomen: Numerous low-attenuation liver masses consistent with cysts. No acute findings. Musculoskeletal: No fracture or acute finding. No osteoblastic or osteolytic lesions. Review of the MIP images confirms the above findings. IMPRESSION: 1. No evidence of a pulmonary embolism. 2. Atelectasis at the base of the right middle and lower lobes above and elevated right hemidiaphragm. Lungs otherwise clear. No evidence of pneumonia or pulmonary edema. Electronically Signed   By: Lajean Manes M.D.   On: 10/11/2018 13:33    Procedures Procedures (including critical care time)  Medications Ordered in ED Medications - No data to display   Initial Impression / Assessment and Plan / ED Course  I have reviewed the triage vital signs and the nursing notes.  Pertinent labs & imaging results that were available during my care of the patient were reviewed by me and considered in my medical decision making (see chart for details).  Iv ns. Continuous pulse ox and monitor. Stat labs. Pcxr.   Reviewed nursing notes and prior charts for additional history.   Labs reviewed - ddimer mildly elev. Trop normal.   cxr reviewed - no pna.   Ct ordered.   Pt requests due for his normal dose abx - given.  Ct reviewed - no PE.   Discussed results w pt.   Pt is currently cp free. No sob or increased wob. Afebrile. Pt currently appears stable for d/c.   rec close outpt f/u w his cardiologist. Return precautions provided.     Final Clinical Impressions(s) / ED Diagnoses   Final diagnoses:  None    ED Discharge Orders    None       Lajean Saver, MD 10/11/18 1356

## 2019-01-15 ENCOUNTER — Other Ambulatory Visit: Payer: Self-pay

## 2019-01-15 NOTE — Patient Outreach (Signed)
First attempt to obtain mRs. No answer. Will call back on Thursday. No DPR on file. Called home phone.

## 2019-01-18 ENCOUNTER — Other Ambulatory Visit: Payer: Self-pay

## 2019-01-18 NOTE — Patient Outreach (Signed)
Telephone outreach to patient to obtain mRs was successfully completed. mRs= 0. 

## 2019-09-05 DIAGNOSIS — C4492 Squamous cell carcinoma of skin, unspecified: Secondary | ICD-10-CM

## 2019-09-05 HISTORY — PX: SKIN BIOPSY: SHX1

## 2019-09-05 HISTORY — DX: Squamous cell carcinoma of skin, unspecified: C44.92

## 2019-09-29 ENCOUNTER — Ambulatory Visit: Payer: 59 | Attending: Internal Medicine

## 2019-09-29 DIAGNOSIS — Z23 Encounter for immunization: Secondary | ICD-10-CM | POA: Insufficient documentation

## 2019-09-29 NOTE — Progress Notes (Signed)
   Covid-19 Vaccination Clinic  Name:  DAOUDA CRESSLER    MRN: [id number] DOB: 04-19-1951  09/29/2019  Mr. Darr was observed post Covid-19 immunization for 15 minutes without incidence. He was provided with Vaccine Information Sheet and instruction to access the V-Safe system.   Mr. Ramakrishnan was instructed to call 911 with any severe reactions post vaccine: Marland Kitchen Difficulty breathing  . Swelling of your face and throat  . A fast heartbeat  . A bad rash all over your body  . Dizziness and weakness    Immunizations Administered    Name Date Dose VIS Date Route   Pfizer COVID-19 Vaccine 09/29/2019 11:10 AM 0.3 mL 08/16/2019 Intramuscular   Manufacturer: [place]   Lot: [id number]   [place]: [id number]

## 2019-10-21 ENCOUNTER — Ambulatory Visit: Payer: 59 | Attending: Internal Medicine

## 2019-10-21 DIAGNOSIS — Z23 Encounter for immunization: Secondary | ICD-10-CM | POA: Insufficient documentation

## 2019-10-21 NOTE — Progress Notes (Signed)
   Covid-19 Vaccination Clinic  Name:  [name]    MRN: [id number] DOB: 1950/11/13  10/21/2019  [name] was observed post Covid-19 immunization for 15 minutes without incidence. He was provided with Vaccine Information Sheet and instruction to access the V-Safe system.   [name] was instructed to call 911 with any severe reactions post vaccine: Marland Kitchen Difficulty breathing  . Swelling of your face and throat  . A fast heartbeat  . A bad rash all over your body  . Dizziness and weakness    Immunizations Administered    Name Date Dose VIS Date Route   Pfizer COVID-19 Vaccine 10/21/2019  8:34 AM 0.3 mL 08/16/2019 Intramuscular   Manufacturer: [place]   Lot: [id number]   [place]: [id number]

## 2019-11-22 ENCOUNTER — Telehealth: Payer: Self-pay | Admitting: Physician Assistant

## 2019-11-22 NOTE — Telephone Encounter (Signed)
Pt's wife calling because he has PDT scheduled for next Friday 3/26. He feel recently and has a healing gash on his face. She wants to know if Vida Roller thinks pt to reschedule PDT?

## 2019-11-22 NOTE — Telephone Encounter (Signed)
Defer please

## 2019-11-22 NOTE — Telephone Encounter (Signed)
Please Advise

## 2019-11-27 NOTE — Telephone Encounter (Signed)
PDT appt was defered to 4/9.

## 2019-11-29 ENCOUNTER — Ambulatory Visit: Payer: 59 | Admitting: Physician Assistant

## 2019-11-29 ENCOUNTER — Ambulatory Visit: Payer: 59

## 2019-12-13 ENCOUNTER — Other Ambulatory Visit: Payer: Self-pay

## 2019-12-13 ENCOUNTER — Ambulatory Visit (INDEPENDENT_AMBULATORY_CARE_PROVIDER_SITE_OTHER): Payer: 59 | Admitting: *Deleted

## 2019-12-13 DIAGNOSIS — L570 Actinic keratosis: Secondary | ICD-10-CM | POA: Diagnosis not present

## 2019-12-13 MED ORDER — AMINOLEVULINIC ACID HCL 10 % EX GEL
2000.0000 mg | Freq: Once | CUTANEOUS | Status: AC
  Administered 2019-12-13: 2000 mg via TOPICAL

## 2019-12-13 NOTE — Progress Notes (Signed)
Patient here for light therapy (PDT). Applied Ameluz to patients scalp. Patient will wait 90 minutes, and then will go under the blue light for 16 min 42 seconds. Patient will follow up with [facility] in 10-12 weeks.

## 2019-12-13 NOTE — Patient Instructions (Signed)

## 2019-12-13 NOTE — Addendum Note (Signed)
Addended by: Ailene Rud on: 12/13/2019 09:35 AM   Modules accepted: Level of Service

## 2020-05-01 ENCOUNTER — Ambulatory Visit: Payer: 59 | Admitting: Physician Assistant

## 2020-05-07 ENCOUNTER — Encounter: Payer: Self-pay | Admitting: Physician Assistant

## 2020-05-07 ENCOUNTER — Other Ambulatory Visit: Payer: Self-pay

## 2020-05-07 ENCOUNTER — Ambulatory Visit (INDEPENDENT_AMBULATORY_CARE_PROVIDER_SITE_OTHER): Payer: 59 | Admitting: Physician Assistant

## 2020-05-07 DIAGNOSIS — Z1283 Encounter for screening for malignant neoplasm of skin: Secondary | ICD-10-CM

## 2020-05-07 DIAGNOSIS — L570 Actinic keratosis: Secondary | ICD-10-CM | POA: Diagnosis not present

## 2020-05-07 DIAGNOSIS — L578 Other skin changes due to chronic exposure to nonionizing radiation: Secondary | ICD-10-CM

## 2020-05-07 DIAGNOSIS — L821 Other seborrheic keratosis: Secondary | ICD-10-CM

## 2020-05-07 DIAGNOSIS — Z85828 Personal history of other malignant neoplasm of skin: Secondary | ICD-10-CM

## 2020-05-07 DIAGNOSIS — D1800 Hemangioma unspecified site: Secondary | ICD-10-CM

## 2020-05-07 MED ORDER — FLUOROURACIL 5 % EX CREA: TOPICAL_CREAM | Freq: Every evening | CUTANEOUS | 2 refills | Status: DC

## 2020-05-07 NOTE — Progress Notes (Signed)
   Follow-Up Visit   Subjective  [name] is a 69 y.o. male who presents for the following: Follow-up (AK).   The following portions of the chart were reviewed this encounter and updated as appropriate: Tobacco  Allergies  Meds  Problems  Med Hx  Surg Hx  Fam Hx      Objective  Well appearing patient in no apparent distress; mood and affect are within normal limits.  A focused examination was performed including waist up and legs. Relevant physical exam findings are noted in the Assessment and Plan.  Objective  Right Postauricular Sulcus: Scars clear  Objective  waist up and legs: No atypical nevi No signs of non-mole skin cancer.   Objective  top of scalp: Diffuse scale  Assessment & Plan  History of SCC (squamous cell carcinoma) of skin Right Postauricular Sulcus  observe  Screening exam for skin cancer waist up and legs  Skin exams  Actinic keratosis top of scalp  Ordered Medications: fluorouracil (EFUDEX) 5 % cream Lentigines - Scattered tan macules - Discussed due to sun exposure - Benign, observe - Call for any changes  Seborrheic Keratoses - Stuck-on, waxy, tan-brown papules and plaques  - Discussed benign etiology and prognosis. - Observe - Call for any changes  Hemangiomas - Red papules - Discussed benign nature - Observe - Call for any changes  Actinic Damage - diffuse scaly erythematous macules with underlying dyspigmentation - Recommend daily broad spectrum sunscreen SPF 30+ to sun-exposed areas, reapply every 2 hours as needed.  - Call for new or changing lesions.  Skin cancer screening performed today.   I, Jarold Macomber, PA-C, have reviewed all documentation's for this visit.  The documentation on 05/07/20 for the exam, diagnosis, procedures and orders are all accurate and complete.

## 2020-09-16 IMAGING — CT CT ANGIO CHEST
3 of 7 series · 19 of 36 positions shown · IV contrast (iopamidol)
Comparison: Current chest radiograph.

CLINICAL DATA: Generalized chest tightness for 1 day. Vomited x1
yesterday.

EXAM:
CT ANGIOGRAPHY CHEST WITH CONTRAST
TECHNIQUE: Multidetector CT imaging of the chest was performed using the
standard protocol during bolus administration of intravenous
contrast. Multiplanar CT image reconstructions and MIPs were
obtained to evaluate the vascular anatomy.
CONTRAST:  75 mL 0YRSPI-WY5 IOPAMIDOL (0YRSPI-WY5) INJECTION 76%

[Series 6: pe lung · axial · 0.91mm/px · z∈[+1182,+1310]mm · 3 of 130 slices shown]
[im 33/130  mediastinal]
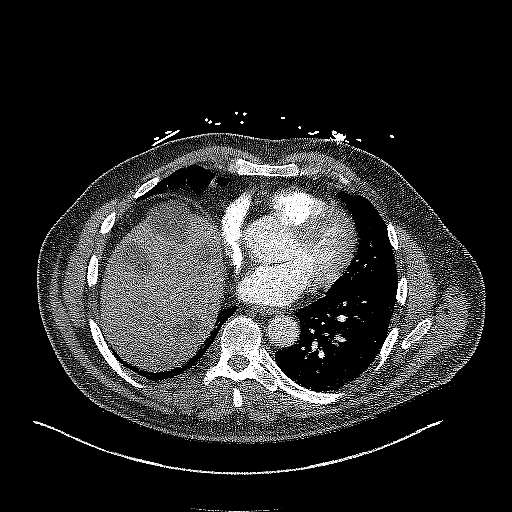
[im 65/130  mediastinal]
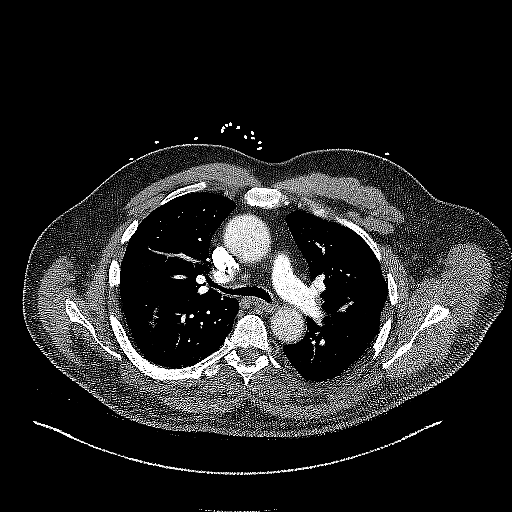
[im 97/130  mediastinal]
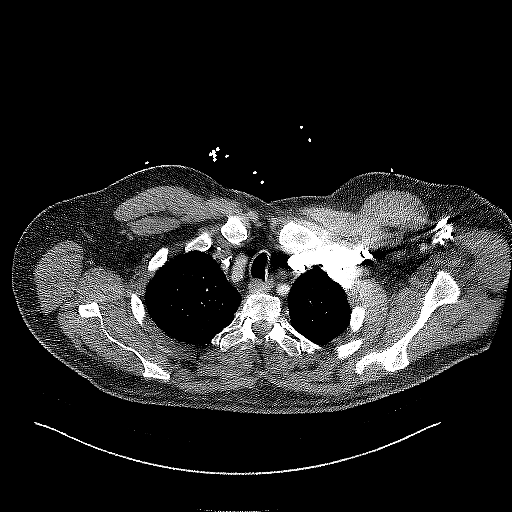

[Series 7: pe thins · axial · 0.91mm/px · z∈[+1080,+1356]mm · 15 of 453 slices shown]
[im 29/453  lung]
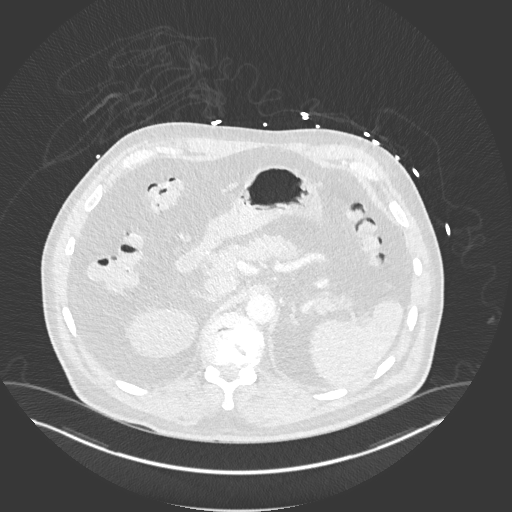
[im 57/453  mediastinal]
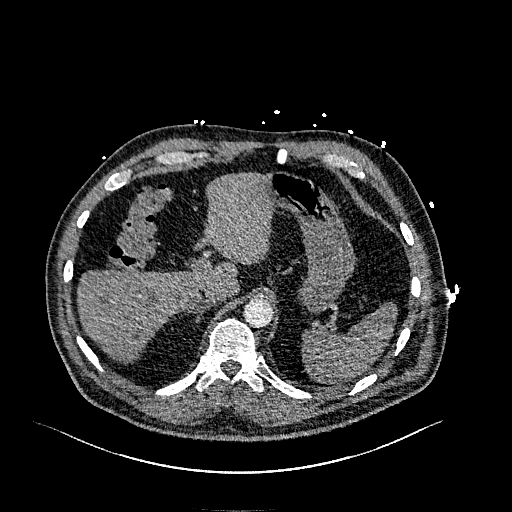
[im 85/453  lung]
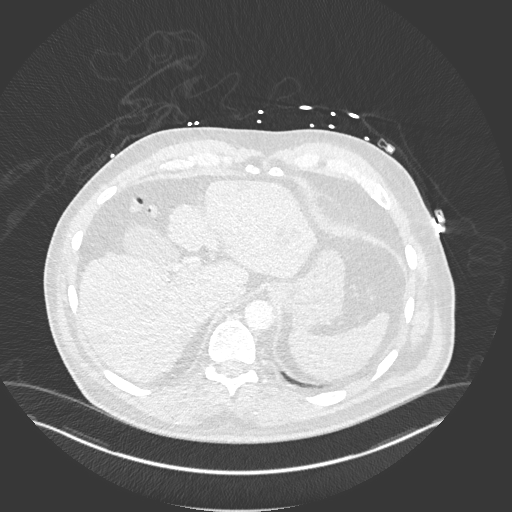
[im 114/453  mediastinal]
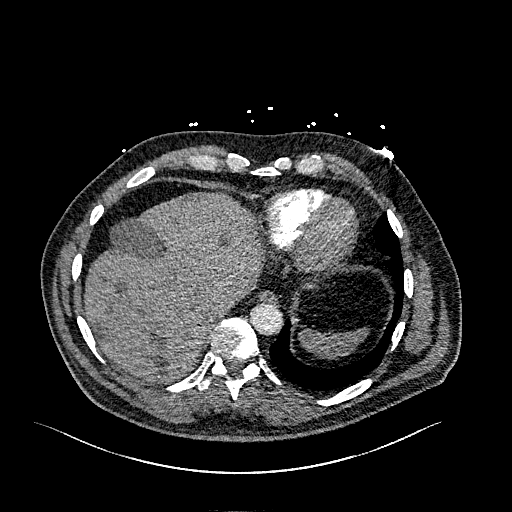
[im 142/453  lung]
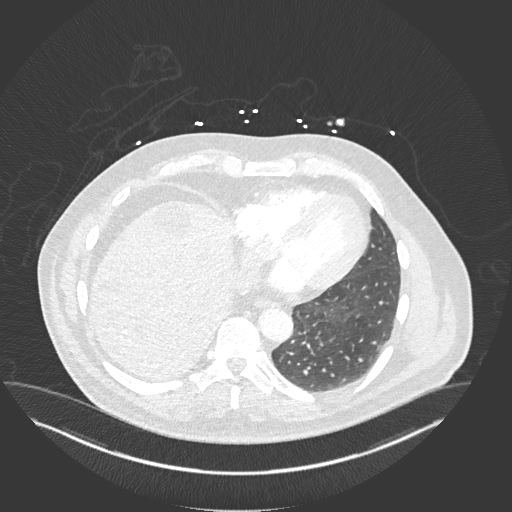
[im 170/453  mediastinal]
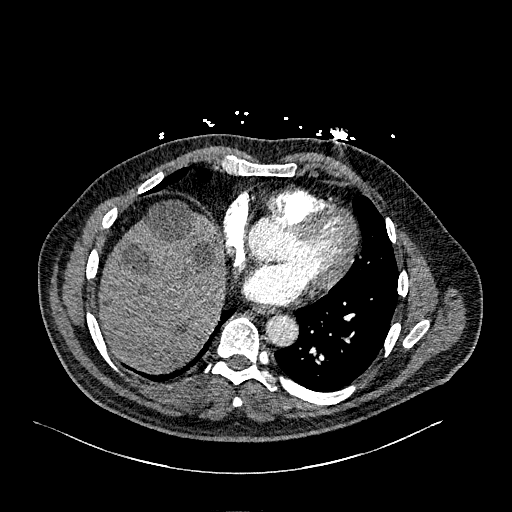
[im 198/453  lung]
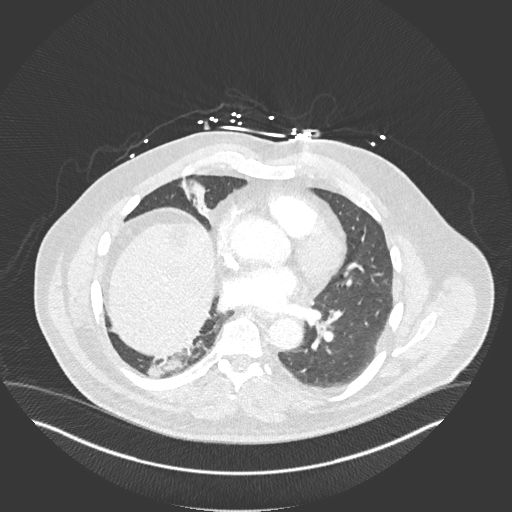
[im 227/453  mediastinal]
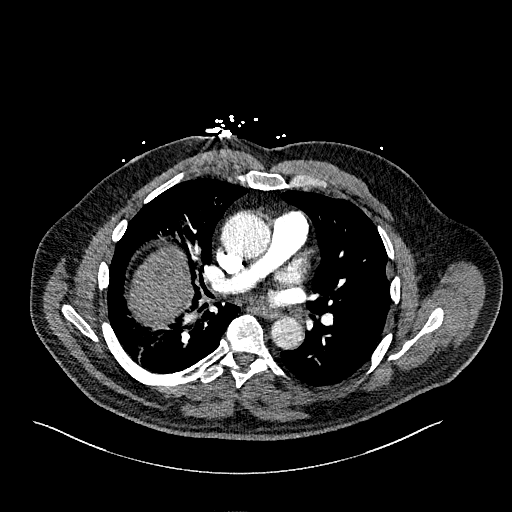
[im 255/453  lung]
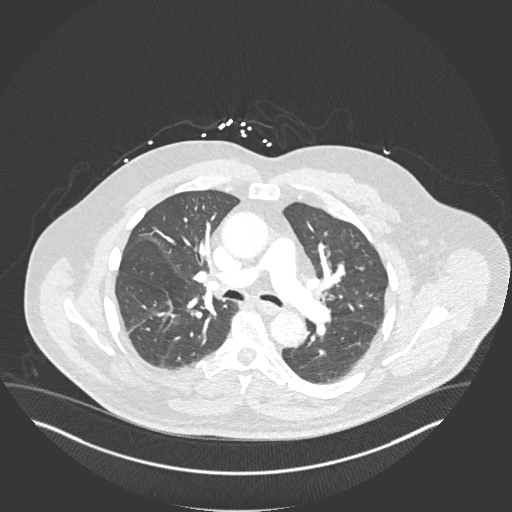
[im 283/453  mediastinal]
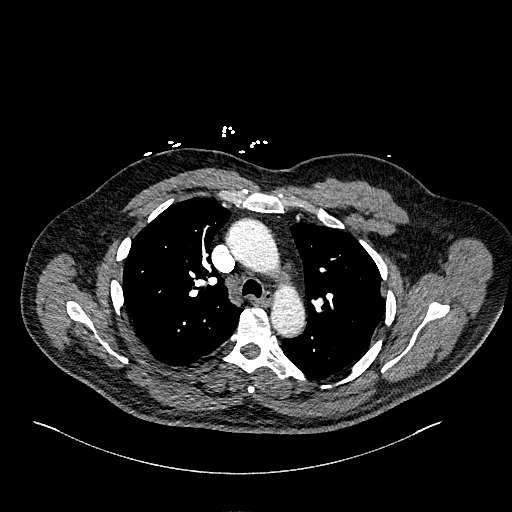
[im 311/453  lung]
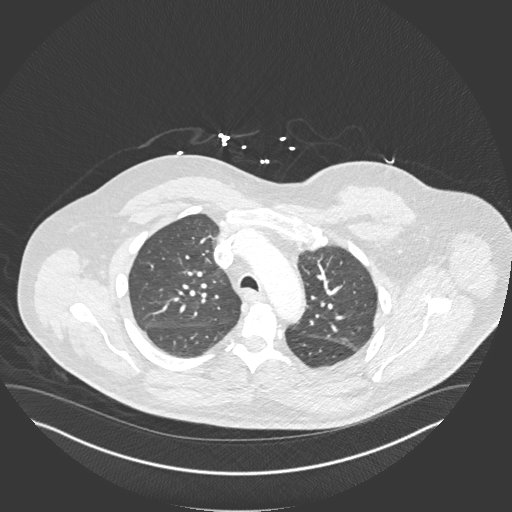
[im 340/453  mediastinal]
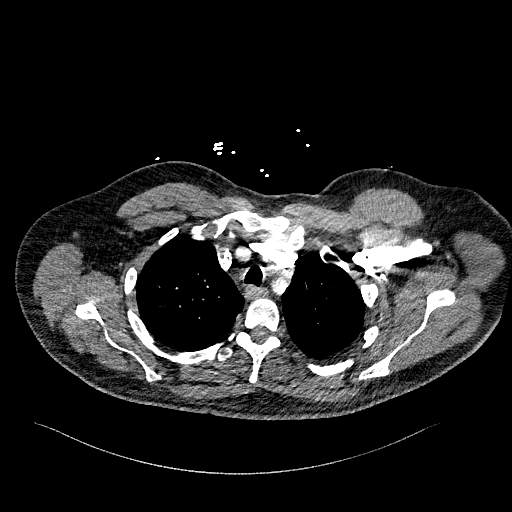
[im 368/453  lung]
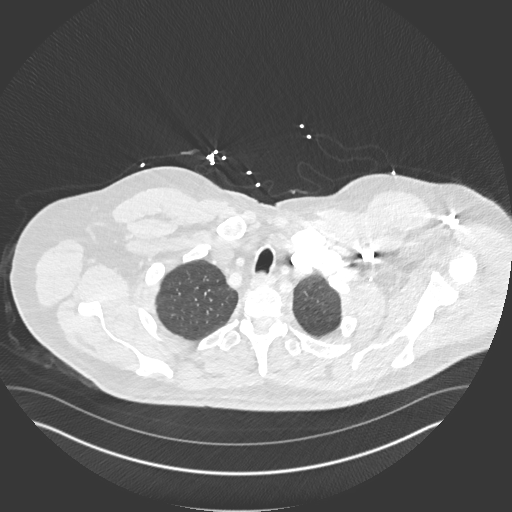
[im 396/453  mediastinal]
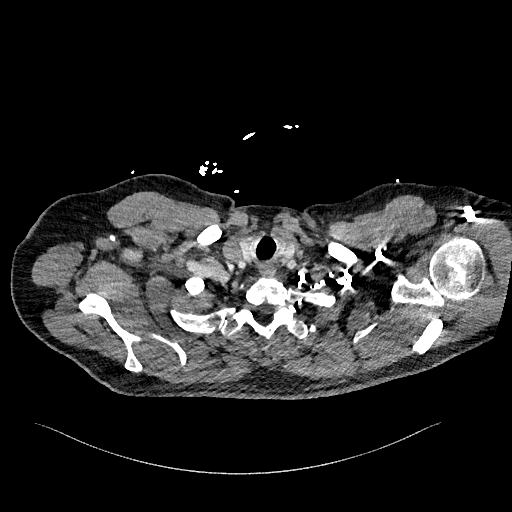
[im 424/453  lung]
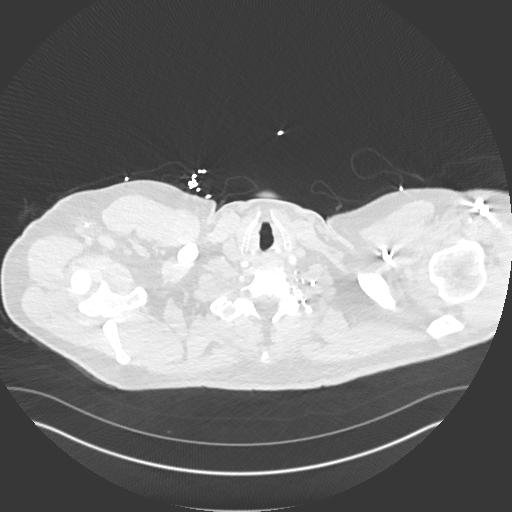

[Series 8: pe 2mm cor · coronal · 0.61mm/px · 1 of 151 slices shown]
[im 76/151  mediastinal]
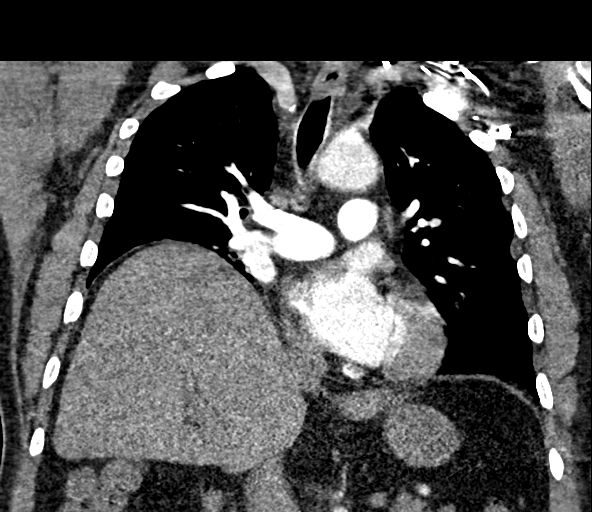

[19 of 36 positions shown; findings below may reference images not displayed]

FINDINGS: Cardiovascular: There is satisfactory opacification of the pulmonary
arteries to the segmental level. There is no evidence of a pulmonary
embolism.

Heart is normal in size and configuration. No pericardial effusion.
Mild left coronary artery calcifications. Ascending aorta is mildly
dilated to 4 cm. No aortic dissection. No atherosclerosis.

Mediastinum/Nodes: No enlarged mediastinal, hilar, or axillary lymph
nodes. Thyroid gland, trachea, and esophagus demonstrate no
significant findings.

Lungs/Pleura: Elevated right hemidiaphragm. There is atelectasis in
the right middle and lower lobes. Lungs otherwise clear. No pleural
effusion or pneumothorax.

Upper Abdomen: Numerous low-attenuation liver masses consistent with
cysts. No acute findings.

Musculoskeletal: No fracture or acute finding. No osteoblastic or
osteolytic lesions.

Review of the MIP images confirms the above findings.
IMPRESSION: 1. No evidence of a pulmonary embolism.
2. Atelectasis at the base of the right middle and lower lobes above
and elevated right hemidiaphragm. Lungs otherwise clear. No evidence
of pneumonia or pulmonary edema.

## 2020-11-04 ENCOUNTER — Ambulatory Visit: Payer: 59 | Admitting: Physician Assistant

## 2020-11-06 ENCOUNTER — Ambulatory Visit: Payer: 59 | Admitting: Physician Assistant

## 2020-12-01 DIAGNOSIS — H9202 Otalgia, left ear: Secondary | ICD-10-CM

## 2020-12-01 HISTORY — DX: Otalgia, left ear: H92.02

## 2021-01-21 ENCOUNTER — Ambulatory Visit: Payer: 59 | Admitting: Physician Assistant

## 2021-12-29 ENCOUNTER — Ambulatory Visit: Payer: 59 | Admitting: Physician Assistant

## 2022-10-11 ENCOUNTER — Ambulatory Visit: Payer: 59 | Admitting: Physician Assistant

## 2023-05-30 ENCOUNTER — Encounter: Payer: Self-pay | Admitting: Physician Assistant

## 2023-06-03 NOTE — Progress Notes (Signed)
Assessment/Plan:     [name] is a very pleasant 72 y.o. year old RH male with a history of hypertension, hyperlipidemia, history of meningitis twice in 5 years, vitamin D deficiency, pre-diabetes, decreased hearing seen today for evaluation of memory difficulties. During the course of the visit, patient and wife reported that attention and impulsiveness of response "saying something before thinking" has been a lifelong issue.During testing, this was evident while drawing and in attention.  MoCA today is 22/30 but he potentially could have scored higher . Anxiety may be playing a role on his memory as well. He does admit that at times he may become frustrated if not performing at "excellent level". He admits to be "OCD". Discussed psychotherapy with him, he agreed to "give it a try".  Workup is in progress.Patient is able to participate on his ADLs and to to drive without difficulties      Memory Difficulties   MRI brain without contrast to assess for underlying structural abnormality and assess vascular load  Neurocognitive testing to further evaluate cognitive concerns and determine other underlying cause of memory changes, including potential contribution from sleep, anxiety, attention, or depression among others  Check B12, TSH Recommend good control of cardiovascular risk factors.   Continue to control mood as per PCP Referral to behavioral therapy for anxiety, possible ADD, OCD. Consider changing omeprazole to an alternative medication (i.e. lansoprazole) as there is evidence that if taking greater than 3 years, can contribute to memory difficulties.  Will await for the MRI brain to consider antidementia meds.  Recommend using the hearing aids for better comprehension. Folllow up in 1 month  Subjective:    The patient is accompanied by his wife  who supplements the history.    How [place] did patient have memory difficulties? He denies any changes, but with the hearing loss he  misses some of the conversation.  Patient reports some difficulty remembering new information, recent conversations, names (I always did). HE continues to work in a high stress environment.  repeats oneself?  Endorsed, not so bad. Disoriented when walking into a room?  Patient denies    Leaving objects in unusual places?  Denies.   Wandering behavior? Denies.   Any personality changes, or depression, anxiety? "He was changing insurance and his anxiety meds were not taken for a couple of months resulting in short temper. He doe s not know how to relax. His OCD is catching up on him"-wife says.  Hallucinations or paranoia? Denies. Seizures? Denies.    Any sleep changes? Does not sleep well because he worries about the business. Denies vivid dreams, REM behavior or sleepwalking.   Sleep apnea? Denies.   Any hygiene concerns?  Denies.   Independent of bathing and dressing? Endorsed  Does the patient need help with medications? Patient is in charge   Who is in charge of the finances? Patient is in charge     Any changes in appetite?   Denies.     Patient have trouble swallowing?  Denies.   Does the patient cook? No  Any headaches?  Denies. Only when he had meningitis 2020 (bacterial from sinus infection),  2022 (viral)  Chronic pain? Denies.   Ambulates with difficulty? Denies. Exercises with a trainer at home.    Recent falls or head injuries? Denies.     Vision changes?   Has a history of "cross eyed when he was a child",then " I was operated 6 times".  Any strokelike symptoms?  Denies.   Any tremors? Denies.  Any anosmia? Denies.   Any incontinence of urine? Denies.   Any bowel dysfunction? "Due to metformin he goes often".      Patient lives with wife History of heavy alcohol intake? Denies.   History of heavy tobacco use? Never. Family history of dementia?  No family with dementia  Does patient drive? Yes, denies any issues      He is in the trucking business.    No Known  Allergies  Current Outpatient Medications  Medication Instructions   acetaminophen (TYLENOL) 1,000 mg, Oral, 2 times daily PRN   azelastine (ASTELIN) 0.1 % nasal spray 1 spray, Each Nare, 2 times daily   cholecalciferol (VITAMIN D) 1,000 Units, Oral, Daily   FLUoxetine (PROZAC) 10 MG capsule 40 capsules, Oral, Daily   fluticasone (FLONASE) 50 MCG/ACT nasal spray 1 spray, Daily   ibuprofen (ADVIL) 800 mg, 3 times daily PRN   LORazepam (ATIVAN) 0.5 mg, Oral, Daily at bedtime   metFORMIN (GLUCOPHAGE) 500 mg, Oral, 2 times daily with meals   Multiple Vitamin (MULTIVITAMIN WITH MINERALS) TABS tablet 1 tablet, Oral, Daily   omeprazole (PRILOSEC) 20 mg, Oral, Daily   rosuvastatin (CRESTOR) 10 mg, Oral, Daily   S-Adenosylmethionine (SAM-E) 400 MG TABS 1 tablet, Daily     VITALS:   Vitals:   06/05/23 0749  BP: (!) 140/90  Pulse: 73  Resp: 20  SpO2: 99%  Weight: 209 lb (94.8 kg)  Height: 5\' 10"  (1.778 m)       No data to display          PHYSICAL EXAM   HEENT:  Normocephalic, atraumatic.  The superficial temporal arteries are without ropiness or tenderness. Cardiovascular: Regular rate and rhythm. Lungs: Clear to auscultation bilaterally. Neck: There are no carotid bruits noted bilaterally.  NEUROLOGICAL:    06/05/2023   12:00 PM  Montreal Cognitive Assessment   Visuospatial/ Executive (0/5) 0  Naming (0/3) 3  Attention: Read list of digits (0/2) 2  Attention: Read list of letters (0/1) 1  Attention: Serial 7 subtraction starting at 100 (0/3) 2  Language: Repeat phrase (0/2) 1  Language : Fluency (0/1) 1  Abstraction (0/2) 1  Delayed Recall (0/5) 5  Orientation (0/6) 6  Total 22  Adjusted Score (based on education) 22        No data to display           Orientation:  Alert and oriented to person, place and time. No aphasia or dysarthria. Fund of knowledge is appropriate. Recent and remote memory today are normal.  Attention and concentration are reduced.  Able  to name objects and repeat phrases Delayed recall  5/5  Cranial nerves: There is good facial symmetry. Extraocular muscles are intact and visual fields are full to confrontational testing. Speech is fluent and clear. No tongue deviation. Hearing is decreased to conversational tone.  Tone: Tone is good throughout. Sensation: Sensation is intact to light touch.  Vibration is intact at the bilateral big toe.  Coordination: The patient has no difficulty with RAM's or FNF bilaterally. Normal finger to nose  Motor: Strength is 5/5 in the bilateral upper and lower extremities. There is no pronator drift. There are no fasciculations noted. DTR's: Deep tendon reflexes are 2/4 bilaterally. Gait and Station: The patient is able to ambulate without difficulty. Gait is cautious and narrow. Stride length is normal        Thank you for allowing [place] the opportunity to  participate in the care of this nice patient. Please do not hesitate to contact us for any questions or concerns.   Total time spent on today's visit was 68 minutes dedicated to this patient today, preparing to see patient, examining the patient, ordering tests and/or medications and counseling the patient, documenting clinical information in the EHR or other health record, independently interpreting results and communicating results to the patient/family, discussing treatment and goals, answering patient's questions and coordinating care.  Cc:  Eartha Inch, MD  [name] 06/05/2023 12:07 PM

## 2023-06-05 ENCOUNTER — Ambulatory Visit (INDEPENDENT_AMBULATORY_CARE_PROVIDER_SITE_OTHER): Payer: Medicare Other | Admitting: Physician Assistant

## 2023-06-05 ENCOUNTER — Encounter: Payer: Self-pay | Admitting: Physician Assistant

## 2023-06-05 ENCOUNTER — Other Ambulatory Visit (INDEPENDENT_AMBULATORY_CARE_PROVIDER_SITE_OTHER): Payer: Medicare Other

## 2023-06-05 VITALS — BP 140/90 | HR 73 | Resp 20 | Ht 70.0 in | Wt 209.0 lb

## 2023-06-05 DIAGNOSIS — F419 Anxiety disorder, unspecified: Secondary | ICD-10-CM | POA: Diagnosis not present

## 2023-06-05 DIAGNOSIS — R41840 Attention and concentration deficit: Secondary | ICD-10-CM

## 2023-06-05 DIAGNOSIS — R413 Other amnesia: Secondary | ICD-10-CM

## 2023-06-05 DIAGNOSIS — G3184 Mild cognitive impairment, so stated: Secondary | ICD-10-CM | POA: Insufficient documentation

## 2023-06-05 LAB — VITAMIN B12: Vitamin B-12: 366 pg/mL (ref 211–911)

## 2023-06-05 LAB — TSH: TSH: 1.25 u[IU]/mL (ref 0.35–5.50)

## 2023-06-05 NOTE — Patient Instructions (Addendum)
It was a pleasure to see you today at our office.   Recommendations:   MRI of the brain, the radiology office will call you to arrange you appointment   Check labs today   Follow up in 1 month  Recommend referral to psychology, situational depression, anxiety and attention issues    For psychiatric meds, mood meds: Please have your primary care physician manage these medications.  If you have any severe symptoms of a stroke, or other severe issues such as confusion,severe chills or fever, etc call 911 or go to the ER as you may need to be evaluated further     For assessment of decision of mental capacity and competency:  Call Dr. Erick Blinks, geriatric psychiatrist at [phone]  Counseling regarding caregiver distress, including caregiver depression, anxiety and issues regarding community resources, adult day care programs, adult living facilities, or memory care questions:  please contact your  Primary Doctor's Social Worker   Whom to call: Memory  decline, memory medications: Call our office [phone]    [email or web address]   RECOMMENDATIONS FOR ALL PATIENTS WITH MEMORY PROBLEMS: 1. Continue to exercise (Recommend 30 minutes of walking everyday, or 3 hours every week) 2. Increase social interactions - continue going to [place] and enjoy social gatherings with friends and family 3. Eat healthy, avoid fried foods and eat more fruits and vegetables 4. Maintain adequate blood pressure, blood sugar, and blood cholesterol level. Reducing the risk of stroke and cardiovascular disease also helps promoting better memory. 5. Avoid stressful situations. Live a simple life and avoid aggravations. Organize your time and prepare for the next day in anticipation. 6. Sleep well, avoid any interruptions of sleep and avoid any distractions in the bedroom that may interfere with adequate sleep quality 7. Avoid sugar, avoid sweets as there  is a strong link between excessive sugar intake, diabetes, and cognitive impairment We discussed the Mediterranean diet, which has been shown to help patients reduce the risk of progressive memory disorders and reduces cardiovascular risk. This includes eating fish, eat fruits and green leafy vegetables, nuts like almonds and hazelnuts, walnuts, and also use olive oil. Avoid fast foods and fried foods as much as possible. Avoid sweets and sugar as sugar use has been linked to worsening of memory function.  There is always a concern of gradual progression of memory problems. If this is the case, then we may need to adjust level of care according to patient needs. Support, both to the patient and caregiver, should then be put into place.      You have been referred for a neuropsychological evaluation (i.e., evaluation of memory and thinking abilities). Please bring someone with you to this appointment if possible, as it is helpful for the doctor to hear from both you and another adult who knows you well. Please bring eyeglasses and hearing aids if you wear them.    The evaluation will take approximately 3 hours and has two parts:   The first part is a clinical interview with the neuropsychologist (Dr. Milbert Coulter or Dr. Roseanne Reno). During the interview, the neuropsychologist will speak with you and the individual you brought to the appointment.    The second part of the evaluation is testing with the doctor's technician Annabelle Harman or Selena Batten). During the testing, the technician will ask you to remember different types of material, solve problems, and answer some questionnaires. Your family member will not be present for this portion of the evaluation.   Please note: We must reserve several  hours of the neuropsychologist's time and the psychometrician's time for your evaluation appointment. As such, there is a No-Show fee of $100. If you are unable to attend any of your appointments, please contact our office as soon as  possible to reschedule.      DRIVING: Regarding driving, in patients with progressive memory problems, driving will be impaired. We advise to have someone else do the driving if trouble finding directions or if minor accidents are reported. Independent driving assessment is available to determine safety of driving.   If you are interested in the driving assessment, you can contact the following:  [facility] [employer] [facility] in [place] [phone]  [facility] [phone]  [facility] [phone]  [facility] [phone] or [phone]   FALL PRECAUTIONS: Be cautious when walking. Scan the area for obstacles that may increase the risk of trips and falls. When getting up in the mornings, sit up at the edge of the bed for a few minutes before getting out of bed. Consider elevating the bed at the head end to avoid drop of blood pressure when getting up. Walk always in a well-lit room (use night lights in the walls). Avoid area rugs or power cords from appliances in the middle of the walkways. Use a walker or a cane if necessary and consider physical therapy for balance exercise. Get your eyesight checked regularly.  FINANCIAL OVERSIGHT: Supervision, especially oversight when making financial decisions or transactions is also recommended.  HOME SAFETY: Consider the safety of the kitchen when operating appliances like stoves, microwave oven, and blender. Consider having supervision and share cooking responsibilities until no longer able to participate in those. Accidents with firearms and other hazards in the house should be identified and addressed as well.   ABILITY TO BE LEFT ALONE: If patient is unable to contact 911 operator, consider using LifeLine, or when the need is there, arrange for someone to stay with patients. Smoking is a fire hazard, consider supervision or cessation. Risk of wandering should be assessed by caregiver and if detected at any  point, supervision and safe proof recommendations should be instituted.  MEDICATION SUPERVISION: Inability to self-administer medication needs to be constantly addressed. Implement a mechanism to ensure safe administration of the medications.      Mediterranean Diet A Mediterranean diet refers to food and lifestyle choices that are based on the traditions of countries located on the [employer]. This way of eating has been shown to help prevent certain conditions and improve outcomes for people who have chronic diseases, like kidney disease and heart disease. What are tips for following this plan? Lifestyle  Cook and eat meals together with your family, when possible. Drink enough fluid to keep your urine clear or pale yellow. Be physically active every day. This includes: Aerobic exercise like running or swimming. Leisure activities like gardening, walking, or housework. Get 7-8 hours of sleep each night. If recommended by your health care provider, drink red wine in moderation. This means 1 glass a day for nonpregnant women and 2 glasses a day for men. A glass of wine equals 5 oz (150 mL). Reading food labels  Check the serving size of packaged foods. For foods such as rice and pasta, the serving size refers to the amount of cooked product, not dry. Check the total fat in packaged foods. Avoid foods that have saturated fat or trans fats. Check the ingredients list for added sugars, such as corn syrup. Shopping  At the grocery store, buy  most of your food from the areas near the walls of the store. This includes: Fresh fruits and vegetables (produce). Grains, beans, nuts, and seeds. Some of these may be available in unpackaged forms or large amounts (in bulk). Fresh seafood. Poultry and eggs. Low-fat dairy products. Buy whole ingredients instead of prepackaged foods. Buy fresh fruits and vegetables in-season from local farmers markets. Buy frozen fruits and vegetables in  resealable bags. If you do not have access to quality fresh seafood, buy precooked frozen shrimp or canned fish, such as tuna, salmon, or sardines. Buy small amounts of raw or cooked vegetables, salads, or olives from the deli or salad bar at your store. Stock your pantry so you always have certain foods on hand, such as olive oil, canned tuna, canned tomatoes, rice, pasta, and beans. Cooking  Cook foods with extra-virgin olive oil instead of using butter or other vegetable oils. Have meat as a side dish, and have vegetables or grains as your main dish. This means having meat in small portions or adding small amounts of meat to foods like pasta or stew. Use beans or vegetables instead of meat in common dishes like chili or lasagna. Experiment with different cooking methods. Try roasting or broiling vegetables instead of steaming or sauteing them. Add frozen vegetables to soups, stews, pasta, or rice. Add nuts or seeds for added healthy fat at each meal. You can add these to yogurt, salads, or vegetable dishes. Marinate fish or vegetables using olive oil, lemon juice, garlic, and fresh herbs. Meal planning  Plan to eat 1 vegetarian meal one day each week. Try to work up to 2 vegetarian meals, if possible. Eat seafood 2 or more times a week. Have healthy snacks readily available, such as: Vegetable sticks with hummus. Greek yogurt. Fruit and nut trail mix. Eat balanced meals throughout the week. This includes: Fruit: 2-3 servings a day Vegetables: 4-5 servings a day Low-fat dairy: 2 servings a day Fish, poultry, or lean meat: 1 serving a day Beans and legumes: 2 or more servings a week Nuts and seeds: 1-2 servings a day Whole grains: 6-8 servings a day Extra-virgin olive oil: 3-4 servings a day Limit red meat and sweets to only a few servings a month What are my food choices? Mediterranean diet Recommended Grains: Whole-grain pasta. Brown rice. Bulgar wheat. Polenta. Couscous.  Whole-wheat bread. Orpah Cobb. Vegetables: Artichokes. Beets. Broccoli. Cabbage. Carrots. Eggplant. Green beans. Chard. Kale. Spinach. Onions. Leeks. Peas. Squash. Tomatoes. Peppers. Radishes. Fruits: Apples. Apricots. Avocado. Berries. Bananas. Cherries. Dates. Figs. Grapes. Lemons. Melon. Oranges. Peaches. Plums. Pomegranate. Meats and other protein foods: Beans. Almonds. Sunflower seeds. Pine nuts. Peanuts. Cod. Salmon. Scallops. Shrimp. Tuna. Tilapia. Clams. Oysters. Eggs. Dairy: Low-fat milk. Cheese. Greek yogurt. Beverages: Water. Red wine. Herbal tea. Fats and oils: Extra virgin olive oil. Avocado oil. Grape seed oil. Sweets and desserts: [place] yogurt with honey. Baked apples. Poached pears. Trail mix. Seasoning and other foods: Basil. Cilantro. Coriander. Cumin. Mint. Parsley. Sage. Rosemary. Tarragon. Garlic. Oregano. Thyme. Pepper. Balsalmic vinegar. Tahini. Hummus. Tomato sauce. Olives. Mushrooms. Limit these Grains: Prepackaged pasta or rice dishes. Prepackaged cereal with added sugar. Vegetables: Deep fried potatoes (french fries). Fruits: Fruit canned in syrup. Meats and other protein foods: Beef. Pork. Lamb. Poultry with skin. Hot dogs. Tomasa Blase. Dairy: Ice cream. Sour cream. Whole milk. Beverages: Juice. Sugar-sweetened soft drinks. Beer. Liquor and spirits. Fats and oils: Butter. Canola oil. Vegetable oil. Beef fat (tallow). Lard. Sweets and desserts: Cookies. Cakes. Pies. Candy. Seasoning and  other foods: Mayonnaise. Premade sauces and marinades. The items listed may not be a complete list. Talk with your dietitian about what dietary choices are right for you. Summary The Mediterranean diet includes both food and lifestyle choices. Eat a variety of fresh fruits and vegetables, beans, nuts, seeds, and whole grains. Limit the amount of red meat and sweets that you eat. Talk with your health care provider about whether it is safe for you to drink red wine in moderation. This  means 1 glass a day for nonpregnant women and 2 glasses a day for men. A glass of wine equals 5 oz (150 mL). This information is not intended to replace advice given to you by your health care provider. Make sure you discuss any questions you have with your health care provider. Document Released: 04/14/2016 Document Revised: 05/17/2016 Document Reviewed: 04/14/2016 Elsevier Interactive Patient Education  2017 [employer].      [facility] [phone]. Labs today [address]

## 2023-06-05 NOTE — Progress Notes (Signed)
B12 on the lower side, start taking B12  at 1000 mcg daily and follow with primary doctor. Thyroid is normal. Thanks

## 2023-06-08 ENCOUNTER — Encounter: Payer: Self-pay | Admitting: Psychology

## 2023-06-16 ENCOUNTER — Encounter: Payer: Self-pay | Admitting: Physician Assistant

## 2023-06-18 ENCOUNTER — Ambulatory Visit
Admission: RE | Admit: 2023-06-18 | Discharge: 2023-06-18 | Disposition: A | Discharge status: Home / Self Care | Payer: 59 | Source: Ambulatory Visit | Attending: Physician Assistant | Admitting: Physician Assistant

## 2023-06-22 NOTE — Progress Notes (Signed)
The MRI of the brain  showed mild hardening of the small blood vessels in the brain in patients with high cholesterol, blood pressure or sugar control issues, sometimes age as well. It did not show any tumors, stroke or bleed.Thanks

## 2023-06-28 ENCOUNTER — Encounter: Payer: Self-pay | Admitting: Physician Assistant

## 2023-06-28 ENCOUNTER — Ambulatory Visit (INDEPENDENT_AMBULATORY_CARE_PROVIDER_SITE_OTHER): Payer: 59 | Admitting: Physician Assistant

## 2023-06-28 VITALS — BP 150/92 | HR 76 | Resp 20 | Ht 70.0 in | Wt 208.0 lb

## 2023-06-28 DIAGNOSIS — R413 Other amnesia: Secondary | ICD-10-CM

## 2023-06-28 DIAGNOSIS — G3184 Mild cognitive impairment, so stated: Secondary | ICD-10-CM | POA: Diagnosis not present

## 2023-06-28 MED ORDER — DONEPEZIL HCL 10 MG PO TABS: ORAL_TABLET | ORAL | 11 refills | Status: DC

## 2023-06-28 NOTE — Progress Notes (Signed)
Assessment/Plan:   Mild Cognitive Impairment of unclear etiology, concern for vascular and AD.   [name] is a very pleasant 72 y.o. RH male with a history of hypertension, hyperlipidemia, history of meningitis twice in 5 years, vitamin D deficiency, pre-diabetes, HOH, anxiety, OCD, seen today in follow up to discuss the MRI of the brain results from 06/22/23. These were personally reviewed, remarkable for chronic  moderate small vessel changes in the white matter, minimal on pons, mild to moderate cerebral atrophy, mild cerebellar atrophy. No acute findings.He was last seen on 06/05/23 with MoCA of 22/30. He is not on antidementia meds at this time. In view of the findings, it is felt prudent to start [facility] in an effort to slow down any progression of memory loss. Patient is able to participate on his ADLs and to drive without difficulties.  This patient is accompanied in the office by his wife who supplements the history.  Previous records as well as any outside records available were reviewed prior to todays visit.     Follow up in  3 months. Start Donepezil 10 mg :Take half tablet (5 mg) daily for 2 weeks, then increase to the full tablet at 10 mg daily. Side effects discussed  Neuropsych testing for clarity of diagnosis and to determine other causes of memory loss.   Continue to control mood as per PCP Recommend good control of cardiovascular risk factors. He has been informed of elevated BP today, recommend evaluation by Cards due to poorly controlled issues with HTN Recommend psychotherapy for depression, anxiety, attention issues. Replenish B12 (366)  Initial visit 06/05/23  How [name] did patient have memory difficulties? He denies any changes, but with the hearing loss he misses some of the conversation.  Patient reports some difficulty remembering new information, recent conversations, names (I always did). HE continues to work in a high stress environment.  repeats oneself?  Endorsed,  not so bad. Disoriented when walking into a room?  Patient denies    Leaving objects in unusual places?  Denies.   Wandering behavior? Denies.   Any personality changes, or depression, anxiety? "He was changing insurance and his anxiety meds were not taken for a couple of months resulting in short temper. He doe s not know how to relax. His OCD is catching up on him"-wife says.  Hallucinations or paranoia? Denies. Seizures? Denies.    Any sleep changes? Does not sleep well because he worries about the business. Denies vivid dreams, REM behavior or sleepwalking.   Sleep apnea? Denies.   Any hygiene concerns?  Denies.   Independent of bathing and dressing? Endorsed  Does the patient need help with medications? Patient is in charge   Who is in charge of the finances? Patient is in charge     Any changes in appetite?   Denies.     Patient have trouble swallowing?  Denies.   Does the patient cook? No  Any headaches?  Denies. Only when he had meningitis 2020 (bacterial from sinus infection),  2022 (viral)  Chronic pain? Denies.   Ambulates with difficulty? Denies. Exercises with a trainer at home.    Recent falls or head injuries? Denies.     Vision changes?   Has a history of "cross eyed when he was a child",then " I was operated 6 times".  Any strokelike symptoms? Denies.   Any tremors? Denies.  Any anosmia? Denies.   Any incontinence of urine? Denies.   Any bowel dysfunction? "Due to  metformin he goes often".      Patient lives with wife History of heavy alcohol intake? Denies.   History of heavy tobacco use? Never. Family history of dementia?  No family with dementia  Does patient drive? Yes, denies any issues      He is in the trucking business.   CURRENT MEDICATIONS:  Outpatient Encounter Medications as of 06/28/2023  Medication Sig   acetaminophen (TYLENOL) 500 MG tablet Take 1,000 mg by mouth 2 (two) times daily as needed for mild pain.   azelastine (ASTELIN) 0.1 % nasal spray  Place 1 spray into both nostrils 2 (two) times daily.    cholecalciferol (VITAMIN D) 1000 UNITS tablet Take 1,000 Units by mouth daily.     FLUoxetine (PROZAC) 10 MG capsule Take 40 capsules by mouth daily.   fluticasone (FLONASE) 50 MCG/ACT nasal spray Place 1 spray into both nostrils daily. (Patient not taking: Reported on 06/05/2023)   ibuprofen (ADVIL,MOTRIN) 800 MG tablet Take 800 mg by mouth 3 (three) times daily as needed for moderate pain. (Patient not taking: Reported on 06/05/2023)   LORazepam (ATIVAN) 0.5 MG tablet Take 0.5 mg by mouth at bedtime.   metFORMIN (GLUCOPHAGE) 500 MG tablet Take 500 mg by mouth 2 (two) times daily with a meal.   Multiple Vitamin (MULTIVITAMIN WITH MINERALS) TABS tablet Take 1 tablet by mouth daily.   omeprazole (PRILOSEC) 20 MG capsule Take 20 mg by mouth daily.   rosuvastatin (CRESTOR) 10 MG tablet Take 10 mg by mouth daily.   S-Adenosylmethionine (SAM-E) 400 MG TABS Take 1 tablet by mouth daily.   (Patient not taking: Reported on 06/05/2023)   No facility-administered encounter medications on file as of 06/28/2023.        No data to display            06/05/2023   12:00 PM  Montreal Cognitive Assessment   Visuospatial/ Executive (0/5) 0  Naming (0/3) 3  Attention: Read list of digits (0/2) 2  Attention: Read list of letters (0/1) 1  Attention: Serial 7 subtraction starting at 100 (0/3) 2  Language: Repeat phrase (0/2) 1  Language : Fluency (0/1) 1  Abstraction (0/2) 1  Delayed Recall (0/5) 5  Orientation (0/6) 6  Total 22  Adjusted Score (based on education) 22   Thank you for allowing [place] the opportunity to participate in the care of this nice patient. Please do not hesitate to contact us for any questions or concerns.   Total time spent on today's visit was 37 minutes dedicated to this patient today, preparing to see patient, examining the patient, ordering tests and/or medications and counseling the patient, documenting clinical  information in the EHR or other health record, independently interpreting results and communicating results to the patient/family, discussing treatment and goals, answering patient's questions and coordinating care.  Cc:  Eartha Inch, MD  [name] 06/28/2023 6:14 AM

## 2023-06-28 NOTE — Patient Instructions (Addendum)
It was a pleasure to see you today at our office.   Recommendations:   Follow up in 3 months  Start Donepezil 10 mg :Take half tablet (5 mg) daily for 2 weeks, then increase to the full tablet at 10 mg daily.    Recommend referral to psychology, situational depression, anxiety and attention issues  Neuropsych testing for clarity of diagnosis and disease trajectory   Continue using hearing aids Replenish B12  at 1000 mcg daily   For psychiatric meds, mood meds: Please have your primary care physician manage these medications.  If you have any severe symptoms of a stroke, or other severe issues such as confusion,severe chills or fever, etc call 911 or go to the ER as you may need to be evaluated further     For assessment of decision of mental capacity and competency:  Call Dr. Erick Blinks, geriatric psychiatrist at [phone]  Counseling regarding caregiver distress, including caregiver depression, anxiety and issues regarding community resources, adult day care programs, adult living facilities, or memory care questions:  please contact your  Primary Doctor's Social Worker   Whom to call: Memory  decline, memory medications: Call our office [phone]    [email or web address]   RECOMMENDATIONS FOR ALL PATIENTS WITH MEMORY PROBLEMS: 1. Continue to exercise (Recommend 30 minutes of walking everyday, or 3 hours every week) 2. Increase social interactions - continue going to [place] and enjoy social gatherings with friends and family 3. Eat healthy, avoid fried foods and eat more fruits and vegetables 4. Maintain adequate blood pressure, blood sugar, and blood cholesterol level. Reducing the risk of stroke and cardiovascular disease also helps promoting better memory. 5. Avoid stressful situations. Live a simple life and avoid aggravations. Organize your time and prepare for the next day in anticipation. 6. Sleep well, avoid any  interruptions of sleep and avoid any distractions in the bedroom that may interfere with adequate sleep quality 7. Avoid sugar, avoid sweets as there is a strong link between excessive sugar intake, diabetes, and cognitive impairment We discussed the Mediterranean diet, which has been shown to help patients reduce the risk of progressive memory disorders and reduces cardiovascular risk. This includes eating fish, eat fruits and green leafy vegetables, nuts like almonds and hazelnuts, walnuts, and also use olive oil. Avoid fast foods and fried foods as much as possible. Avoid sweets and sugar as sugar use has been linked to worsening of memory function.  There is always a concern of gradual progression of memory problems. If this is the case, then we may need to adjust level of care according to patient needs. Support, both to the patient and caregiver, should then be put into place.      You have been referred for a neuropsychological evaluation (i.e., evaluation of memory and thinking abilities). Please bring someone with you to this appointment if possible, as it is helpful for the doctor to hear from both you and another adult who knows you well. Please bring eyeglasses and hearing aids if you wear them.    The evaluation will take approximately 3 hours and has two parts:   The first part is a clinical interview with the neuropsychologist (Dr. Milbert Coulter or Dr. Roseanne Reno). During the interview, the neuropsychologist will speak with you and the individual you brought to the appointment.    The second part of the evaluation is testing with the doctor's technician Annabelle Harman or Selena Batten). During the testing, the technician will ask you to remember different types of material,  solve problems, and answer some questionnaires. Your family member will not be present for this portion of the evaluation.   Please note: We must reserve several hours of the neuropsychologist's time and the psychometrician's time for your  evaluation appointment. As such, there is a No-Show fee of $100. If you are unable to attend any of your appointments, please contact our office as soon as possible to reschedule.      DRIVING: Regarding driving, in patients with progressive memory problems, driving will be impaired. We advise to have someone else do the driving if trouble finding directions or if minor accidents are reported. Independent driving assessment is available to determine safety of driving.   If you are interested in the driving assessment, you can contact the following:  [facility] [employer] [facility] in [place] [phone]  [facility] [phone]  [facility] [phone]  [facility] [phone] or [phone]   FALL PRECAUTIONS: Be cautious when walking. Scan the area for obstacles that may increase the risk of trips and falls. When getting up in the mornings, sit up at the edge of the bed for a few minutes before getting out of bed. Consider elevating the bed at the head end to avoid drop of blood pressure when getting up. Walk always in a well-lit room (use night lights in the walls). Avoid area rugs or power cords from appliances in the middle of the walkways. Use a walker or a cane if necessary and consider physical therapy for balance exercise. Get your eyesight checked regularly.  FINANCIAL OVERSIGHT: Supervision, especially oversight when making financial decisions or transactions is also recommended.  HOME SAFETY: Consider the safety of the kitchen when operating appliances like stoves, microwave oven, and blender. Consider having supervision and share cooking responsibilities until no longer able to participate in those. Accidents with firearms and other hazards in the house should be identified and addressed as well.   ABILITY TO BE LEFT ALONE: If patient is unable to contact 911 operator, consider using LifeLine, or when the need is there, arrange for someone to  stay with patients. Smoking is a fire hazard, consider supervision or cessation. Risk of wandering should be assessed by caregiver and if detected at any point, supervision and safe proof recommendations should be instituted.  MEDICATION SUPERVISION: Inability to self-administer medication needs to be constantly addressed. Implement a mechanism to ensure safe administration of the medications.      Mediterranean Diet A Mediterranean diet refers to food and lifestyle choices that are based on the traditions of countries located on the [place]. This way of eating has been shown to help prevent certain conditions and improve outcomes for people who have chronic diseases, like kidney disease and heart disease. What are tips for following this plan? Lifestyle  Cook and eat meals together with your family, when possible. Drink enough fluid to keep your urine clear or pale yellow. Be physically active every day. This includes: Aerobic exercise like running or swimming. Leisure activities like gardening, walking, or housework. Get 7-8 hours of sleep each night. If recommended by your health care provider, drink red wine in moderation. This means 1 glass a day for nonpregnant women and 2 glasses a day for men. A glass of wine equals 5 oz (150 mL). Reading food labels  Check the serving size of packaged foods. For foods such as rice and pasta, the serving size refers to the amount of cooked product, not dry. Check the total fat in packaged foods.  Avoid foods that have saturated fat or trans fats. Check the ingredients list for added sugars, such as corn syrup. Shopping  At the grocery store, buy most of your food from the areas near the walls of the store. This includes: Fresh fruits and vegetables (produce). Grains, beans, nuts, and seeds. Some of these may be available in unpackaged forms or large amounts (in bulk). Fresh seafood. Poultry and eggs. Low-fat dairy products. Buy whole  ingredients instead of prepackaged foods. Buy fresh fruits and vegetables in-season from local farmers markets. Buy frozen fruits and vegetables in resealable bags. If you do not have access to quality fresh seafood, buy precooked frozen shrimp or canned fish, such as tuna, salmon, or sardines. Buy small amounts of raw or cooked vegetables, salads, or olives from the deli or salad bar at your store. Stock your pantry so you always have certain foods on hand, such as olive oil, canned tuna, canned tomatoes, rice, pasta, and beans. Cooking  Cook foods with extra-virgin olive oil instead of using butter or other vegetable oils. Have meat as a side dish, and have vegetables or grains as your main dish. This means having meat in small portions or adding small amounts of meat to foods like pasta or stew. Use beans or vegetables instead of meat in common dishes like chili or lasagna. Experiment with different cooking methods. Try roasting or broiling vegetables instead of steaming or sauteing them. Add frozen vegetables to soups, stews, pasta, or rice. Add nuts or seeds for added healthy fat at each meal. You can add these to yogurt, salads, or vegetable dishes. Marinate fish or vegetables using olive oil, lemon juice, garlic, and fresh herbs. Meal planning  Plan to eat 1 vegetarian meal one day each week. Try to work up to 2 vegetarian meals, if possible. Eat seafood 2 or more times a week. Have healthy snacks readily available, such as: Vegetable sticks with hummus. Greek yogurt. Fruit and nut trail mix. Eat balanced meals throughout the week. This includes: Fruit: 2-3 servings a day Vegetables: 4-5 servings a day Low-fat dairy: 2 servings a day Fish, poultry, or lean meat: 1 serving a day Beans and legumes: 2 or more servings a week Nuts and seeds: 1-2 servings a day Whole grains: 6-8 servings a day Extra-virgin olive oil: 3-4 servings a day Limit red meat and sweets to only a few servings  a month What are my food choices? Mediterranean diet Recommended Grains: Whole-grain pasta. Brown rice. Bulgar wheat. Polenta. Couscous. Whole-wheat bread. Orpah Cobb. Vegetables: Artichokes. Beets. Broccoli. Cabbage. Carrots. Eggplant. Green beans. Chard. Kale. Spinach. Onions. Leeks. Peas. Squash. Tomatoes. Peppers. Radishes. Fruits: Apples. Apricots. Avocado. Berries. Bananas. Cherries. Dates. Figs. Grapes. Lemons. Melon. Oranges. Peaches. Plums. Pomegranate. Meats and other protein foods: Beans. Almonds. Sunflower seeds. Pine nuts. Peanuts. Cod. Salmon. Scallops. Shrimp. Tuna. Tilapia. Clams. Oysters. Eggs. Dairy: Low-fat milk. Cheese. Greek yogurt. Beverages: Water. Red wine. Herbal tea. Fats and oils: Extra virgin olive oil. Avocado oil. Grape seed oil. Sweets and desserts: [place] yogurt with honey. Baked apples. Poached pears. Trail mix. Seasoning and other foods: Basil. Cilantro. Coriander. Cumin. Mint. Parsley. Sage. Rosemary. Tarragon. Garlic. Oregano. Thyme. Pepper. Balsalmic vinegar. Tahini. Hummus. Tomato sauce. Olives. Mushrooms. Limit these Grains: Prepackaged pasta or rice dishes. Prepackaged cereal with added sugar. Vegetables: Deep fried potatoes (french fries). Fruits: Fruit canned in syrup. Meats and other protein foods: Beef. Pork. Lamb. Poultry with skin. Hot dogs. Tomasa Blase. Dairy: Ice cream. Sour cream. Whole milk. Beverages: Juice. Sugar-sweetened  soft drinks. Beer. Liquor and spirits. Fats and oils: Butter. Canola oil. Vegetable oil. Beef fat (tallow). Lard. Sweets and desserts: Cookies. Cakes. Pies. Candy. Seasoning and other foods: Mayonnaise. Premade sauces and marinades. The items listed may not be a complete list. Talk with your dietitian about what dietary choices are right for you. Summary The Mediterranean diet includes both food and lifestyle choices. Eat a variety of fresh fruits and vegetables, beans, nuts, seeds, and whole grains. Limit the amount of  red meat and sweets that you eat. Talk with your health care provider about whether it is safe for you to drink red wine in moderation. This means 1 glass a day for nonpregnant women and 2 glasses a day for men. A glass of wine equals 5 oz (150 mL). This information is not intended to replace advice given to you by your health care provider. Make sure you discuss any questions you have with your health care provider. Document Released: 04/14/2016 Document Revised: 05/17/2016 Document Reviewed: 04/14/2016 Elsevier Interactive Patient Education  2017 [employer].      [facility] [phone]. Labs today [address]

## 2023-07-04 ENCOUNTER — Ambulatory Visit: Payer: BC Managed Care – PPO

## 2023-07-04 ENCOUNTER — Ambulatory Visit (INDEPENDENT_AMBULATORY_CARE_PROVIDER_SITE_OTHER): Payer: Medicare Other | Admitting: Psychology

## 2023-07-04 ENCOUNTER — Encounter: Payer: Self-pay | Admitting: Psychology

## 2023-07-04 DIAGNOSIS — G039 Meningitis, unspecified: Secondary | ICD-10-CM | POA: Insufficient documentation

## 2023-07-04 DIAGNOSIS — H9190 Unspecified hearing loss, unspecified ear: Secondary | ICD-10-CM | POA: Insufficient documentation

## 2023-07-04 DIAGNOSIS — I679 Cerebrovascular disease, unspecified: Secondary | ICD-10-CM | POA: Insufficient documentation

## 2023-07-04 DIAGNOSIS — G3184 Mild cognitive impairment, so stated: Secondary | ICD-10-CM

## 2023-07-04 DIAGNOSIS — R4189 Other symptoms and signs involving cognitive functions and awareness: Secondary | ICD-10-CM

## 2023-07-04 DIAGNOSIS — E559 Vitamin D deficiency, unspecified: Secondary | ICD-10-CM | POA: Insufficient documentation

## 2023-07-04 HISTORY — DX: Mild cognitive impairment of uncertain or unknown etiology: G31.84

## 2023-07-04 NOTE — Progress Notes (Signed)
NEUROPSYCHOLOGICAL EVALUATION [place] Department of Neurology  Date of Evaluation: July 04, 2023  Reason for Referral:   [name] is a 72 y.o. right-handed Caucasian male referred by Brian Kays, PA-C, to characterize his current cognitive functioning and assist with diagnostic clarity and treatment planning in the context of subjective cognitive decline.   Assessment and Plan:   Clinical Impression(s): Mr. [name] pattern of performance is suggestive of isolated deficits across phonemic fluency and confrontation naming, as well as performance variability across verbal memory. Performances were appropriate relative to age-matched peers across processing speed, attention/concentration, executive functioning, safety/judgment, receptive language, semantic fluency, visuospatial abilities, and visual learning and memory. Mr. Soergel denied difficulties completing instrumental activities of daily living (ADLs) independently. As such, given evidence for cognitive dysfunction described above, he meets criteria for a Mild Neurocognitive Disorder ("mild cognitive impairment"). However, the mild nature of this diagnosis should be emphasized at the present time.   Specific to verbal memory testing, Mr. [name] had trouble efficiently learning a lengthy list of words. However, encoding (i.e., learning) abilities were appropriate across story and daily living tasks. Delayed retention rates were appropriate across all three tasks, ranging from 94% to 133%. Despite strong retention rates, he then had trouble differentiating targets from foils across both list and daily living tasks and was prone to mixing up details (e.g., he answered in the affirmative for many words across the distractor list B which were semantically related to the target words from list A). Overall, given strong retention rates, memory dysfunction would favor a presentation of inefficient learning and poor  discriminability rather than rapid forgetting and a pronounced storage impairment. Additionally, while confrontation naming exhibited a normative weakness, he only missed three items across this task and he and his wife reported no day-to-day concerns. As such, this may represent a normative weakness rather than a clinical weakness. Overall, current testing patterns do not offer strongly compelling evidence to suggest underlying Alzheimer's disease. This will remain important to monitor this over time to assess progressive decline versus ongoing stability.   At the present time, the more likely explanation for current weaknesses across testing and day-to-day dysfunction would appear to be multifactorial in nature. Medically, Mr. [name] has a history of two separate instances of meningitis (suspected bacterial in 2020 and viral in 2022), various cardiovascular ailments (i.e., hypertension, hyperlipidemia, type II diabetes), and prior neuroimaging suggesting moderate microvascular ischemic disease. All of which, especially when combined, can certainly create inefficiencies with encoding aspects of memory, as well as trouble with spontaneous information retrieval. Ongoing hearing loss, psychiatric distress surrounding anxiety, and various day-to-day stressors would certainly compound ongoing difficulties.   Recommendations: A repeat neuropsychological evaluation in 18-24 months (or sooner if functional decline is noted) is recommended to assess the trajectory of future cognitive decline should it occur. This will also aid in future efforts towards improved diagnostic clarity.  A combination of medication and psychotherapy has been shown to be most effective at treating psychiatric distress. As such, Mr. [name] is encouraged to speak with his prescribing physician regarding medication adjustments to optimally manage these symptoms. Likewise, Mr. [name] could consider engaging in short-term psychotherapy to address  symptoms of psychiatric distress. He would benefit from an active and collaborative therapeutic environment, rather than one purely supportive in nature. Recommended treatment modalities include Cognitive Behavioral Therapy (CBT) or Acceptance and Commitment Therapy (ACT).  Should there be progression of current deficits over time, Mr. [name] is unlikely to regain  any independent living skills lost. Therefore, it is recommended that he remain as involved as possible in all aspects of household chores, finances, and medication management, with supervision to ensure adequate performance. He will likely benefit from the establishment and maintenance of a routine in order to maximize his functional abilities over time.  It will be important for Mr. [name] to have another person with him when in situations where he may need to process information, weigh the pros and cons of different options, and make decisions, in order to ensure that he fully understands and recalls all information to be considered.  If not already done, Mr. [name] and his family may want to discuss his wishes regarding durable power of attorney and medical decision making, so that he can have input into these choices. If they require legal assistance with this, Reap-term care resource access, or other aspects of estate planning, they could reach out to [employer] at [phone] for a free consultation.  Mr. [name] is encouraged to attend to lifestyle factors for brain health (e.g., regular physical exercise, good nutrition habits and consideration of the MIND-DASH diet, regular participation in cognitively-stimulating activities, and general stress management techniques), which are likely to have benefits for both emotional adjustment and cognition. In fact, in addition to promoting good general health, regular exercise incorporating aerobic activities (e.g., brisk walking, jogging, cycling, etc.) has been demonstrated to be a very effective  treatment for depression and stress, with similar efficacy rates to both antidepressant medication and psychotherapy. Optimal control of vascular risk factors (including safe cardiovascular exercise and adherence to dietary recommendations) is encouraged. Continued participation in activities which provide mental stimulation and social interaction is also recommended.   Memory can be improved using internal strategies such as rehearsal, repetition, chunking, mnemonics, association, and imagery. External strategies such as written notes in a consistently used memory journal, visual and nonverbal auditory cues such as a calendar on the refrigerator or appointments with alarm, such as on a cell phone, can also help maximize recall.    When learning new information, he would benefit from information being broken up into small, manageable pieces. he may also find it helpful to articulate the material in his own words and in a context to promote encoding at the onset of a new task. This material may need to be repeated multiple times to promote encoding.  Because he shows better recall for structured information, he will likely understand and retain new information better if it is presented to him in a meaningful or well-organized manner at the outset, such as grouping items into meaningful categories or presenting information in an outlined, bulleted, or story format.  To address problems with fluctuating attention and/or executive dysfunction, he may wish to consider:   -Avoiding external distractions when needing to concentrate   -Limiting exposure to fast paced environments with multiple sensory demands   -Writing down complicated information and using checklists   -Attempting and completing one task at a time (i.e., no multi-tasking)   -Verbalizing aloud each step of a task to maintain focus   -Taking frequent breaks during the completion of steps/tasks to avoid fatigue   -Reducing the amount of  information considered at one time   -Scheduling more difficult activities for a time of day where he is usually most alert  Review of Records:   Mr. Weldin was seen by [facility] Brian Kays, PA-C) on 06/05/2023 for an evaluation of memory loss. At that time, he and his wife  wondered if memory difficulties were related to hearing loss (i.e., he misses parts of the conversation as he cannot hear specific details) versus actual retention concerns. He did report a longstanding difficulty with name recollection, and perhaps some newer difficulty recalling details of recent conversations. He continues to work in a high stress job and has underlying anxiety. Medically, there is also a history of meningitis (bacterial in 2020 and viral in 2022). ADL dysfunction was denied. Performance on a brief cognitive screening instrument (MOCA) was 22/30. Ultimately, Mr. Bardin was referred for a comprehensive neuropsychological evaluation to characterize his cognitive abilities and to assist with diagnostic clarity and treatment planning.   Neuroimaging Brain MRI on 10/06/2018 revealed mild atrophy and mild microvascular ischemic disease. Prominent ventricles were said to most likely be related to central atrophy. Brain MRI on 06/22/2023 revealed mild to moderate cerebral atrophy, mild cerebellar atrophy, and moderate microvascular ischemic disease.   Past Medical History:  Diagnosis Date   Abnormal ECG 12/24/2014   Early repolarization     Controlled type 2 diabetes mellitus without complication, without [place]-term current use of insulin 04/07/2014   Esophageal dysphagia 01/05/2018   Essential hypertension 05/21/2013   Generalized anxiety disorder 05/21/2013   w/ OCD tendencies   Hearing loss    History of adenomatous polyp of colon    Jaw pain    Left ear pain 12/01/2020   Lumbar spondylosis    Meningitis    2020-bacterial; 2022-viral   Mixed hyperlipidemia 05/21/2013   Pure hypercholesterolemia  01/05/2018   Sinusitis 10/06/2018   Squamous cell carcinoma of skin 09/05/2019   right posterior ear - tx after biopsy   Vitamin D deficiency     Past Surgical History:  Procedure Laterality Date   BACK SURGERY     COLONOSCOPY  07/21/2004, 04/03/2010   EYE MUSCLE REPAIR     HERNIA REPAIR  1962   LUMBAR FUSION  04/17/2009   L4-L5   LUMBAR FUSION  1992   L4-L5   SKIN BIOPSY Right 09/05/2019   right posterior ear - tx after biopsy   Current Outpatient Medications:    acetaminophen (TYLENOL) 500 MG tablet, Take 1,000 mg by mouth 2 (two) times daily as needed for mild pain., Disp: , Rfl:    azelastine (ASTELIN) 0.1 % nasal spray, Place 1 spray into both nostrils 2 (two) times daily. , Disp: , Rfl:    cholecalciferol (VITAMIN D) 1000 UNITS tablet, Take 1,000 Units by mouth daily.  , Disp: , Rfl:    cyanocobalamin (VITAMIN B12) 1000 MCG tablet, Take 1,000 mcg by mouth daily., Disp: , Rfl:    donepezil (ARICEPT) 10 MG tablet, Take half tablet (5 mg) daily for 2 weeks, then increase to one  tablet at 10 mg daily, Disp: 30 tablet, Rfl: 11   FLUoxetine (PROZAC) 10 MG capsule, Take 40 capsules by mouth daily., Disp: , Rfl:    fluticasone (FLONASE) 50 MCG/ACT nasal spray, Place 1 spray into both nostrils daily. (Patient not taking: Reported on 06/05/2023), Disp: , Rfl:    ibuprofen (ADVIL,MOTRIN) 800 MG tablet, Take 800 mg by mouth 3 (three) times daily as needed for moderate pain. (Patient not taking: Reported on 06/05/2023), Disp: , Rfl:    LORazepam (ATIVAN) 0.5 MG tablet, Take 0.5 mg by mouth at bedtime., Disp: , Rfl:    metFORMIN (GLUCOPHAGE) 500 MG tablet, Take 500 mg by mouth 2 (two) times daily with a meal., Disp: , Rfl:    Multiple Vitamin (MULTIVITAMIN WITH MINERALS)  TABS tablet, Take 1 tablet by mouth daily., Disp: , Rfl:    omeprazole (PRILOSEC) 20 MG capsule, Take 20 mg by mouth daily., Disp: , Rfl:    rosuvastatin (CRESTOR) 10 MG tablet, Take 10 mg by mouth daily., Disp: , Rfl:     S-Adenosylmethionine (SAM-E) 400 MG TABS, Take 1 tablet by mouth daily.   (Patient not taking: Reported on 06/05/2023), Disp: , Rfl:   Clinical Interview:   The following information was obtained during a clinical interview with Mr. Souva and his wife prior to cognitive testing.  Cognitive Symptoms: Decreased short-term memory: Endorsed. He described a longstanding weakness surrounding name recollection. He also noted some difficulty misplacing his phone. His wife described instances where he will seem to forget details of past conversations and ask repetitive questions. However, she questioned if this was more due to ongoing hearing loss and attentional concerns as opposed to true rapid forgetting. Neither described a distinct recent period of progressive cognitive decline.  Decreased [place]-term memory: Denied. Decreased attention/concentration: Endorsed. He acknowledged some trouble with increased distractibility lately. Some of this was attributed to stress, especially in the work place.  Reduced processing speed: Denied. Difficulties with executive functions: Denied. Difficulties with emotion regulation: Denied. Difficulties with receptive language: Denied. Difficulties with word finding: Denied. Decreased visuoperceptual ability: Denied.  Difficulties completing ADLs: Denied.  Additional Medical History: History of traumatic brain injury/concussion: Denied. History of stroke: Denied. History of seizure activity: Denied. History of known exposure to toxins: Denied. Symptoms of chronic pain: Denied. Experience of frequent headaches/migraines: Denied. Frequent instances of dizziness/vertigo: Denied.  Sensory changes: He wears glasses and utilizes hearing aids with benefit. Hearing loss was said to pre-date meningitis concerns by many years. Other sensory changes/difficulties (e.g., taste and smell) were not reported.  Balance/coordination difficulties: Denied. Other motor difficulties:  Denied.  Other medical conditions: As stated above, he was diagnosed with suspected bacterial meningitis in 2020 and viral meningitis in 2022. There have also been more recent concerns surrounding MRSA stemming from an infection involving a dental implant. He and his wife did not report prominent cognitive decline or persisting difficulties surrounding his meningitis experiences. He described some mental fogginess around his MRSA diagnosis and treatment.   Sleep History: Estimated hours obtained each night: 8 hours.  Difficulties falling asleep: Denied. Difficulties staying asleep: Endorsed. Sleep was described as broken in nature. He reported waking in the middle of the night and experiencing an overactive mind due to various work-related stressors. He also wakes throughout the night to use the restroom.  Feels rested and refreshed upon awakening: Variably so depending on the quantity and quality of sleep obtained the night before.   History of snoring: Endorsed "some" per his wife.  History of waking up gasping for air: Denied. Witnessed breath cessation while asleep: Denied.  History of vivid dreaming: Endorsed. Excessive movement while asleep: Denied. Instances of acting out his dreams: Denied.  Psychiatric/Behavioral Health History: Depression: He described his mood as fairly positive overall. He did describe notable work-related stress which can negatively impact his day-to-day mood. His wife also noted some irritability surrounding technology difficulties from time to time. Current suicidal ideation, intent, or plan was denied.  Anxiety: He reported somewhat longstanding generalized anxious distress, notably improved with current medications. His wife noted that anxiety has a tendency to escalate very quickly.  Mania: Denied. Trauma History: Denied. Visual/auditory hallucinations: Denied. Delusional thoughts: Denied.  Tobacco: Denied. Alcohol: He reported minimal alcohol consumption  and denied a history of problematic  alcohol abuse or dependence.  Recreational drugs: Denied.  Family History: Problem Relation Age of Onset   Heart failure Mother    Hypertension Mother    Hypertension Father    Breast cancer Sister    Prostate cancer Brother    Heart attack Brother 58   Heart failure Maternal Grandmother    Heart failure Maternal Grandfather    Heart failure Paternal Grandmother    Heart failure Paternal Grandfather    This information was confirmed by Mr. Gardy.  Academic/Vocational History: Highest level of educational attainment: 16 years. He graduated from [employer] with a Bachelor's degree in business administration. He described himself as a good (A/B) student in academic settings. No relative weaknesses were identified.  History of developmental delay: Denied. History of grade repetition: Denied. Enrollment in special education courses: Denied. History of LD/ADHD: Denied.  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 He noted that his son has generally taken over management of the company and its day-to-day operations.   Evaluation Results:   Behavioral Observations: Mr. Laessig was accompanied by his wife, arrived to his appointment on time, and was appropriately dressed and groomed. He appeared alert and oriented. Observed gait and station were within normal limits. Gross motor functioning appeared intact upon informal observation and no abnormal movements (e.g., tremors) were noted. His affect was generally relaxed and positive. Spontaneous speech was fluent and word finding difficulties were not observed during the clinical interview. Thought processes were coherent, organized, and normal in content. Insight into his cognitive difficulties appeared adequate.   During testing, sustained attention was appropriate. Task engagement was adequate and he persisted when challenged.  Overall, Mr. Woodman was cooperative with the clinical interview and subsequent testing procedures.   Adequacy of Effort: The validity of neuropsychological testing is limited by the extent to which the individual being tested may be assumed to have exerted adequate effort during testing. Mr. Vandersluis expressed his intention to perform to the best of his abilities and exhibited adequate task engagement and persistence. Scores across stand-alone and embedded performance validity measures were variable but largely within expectation. While some caution with interpretation may be prudent, I believe that the current results should be viewed as a largely valid representation of Mr. Dubow current cognitive functioning.  Test Results: Mr. Lamirande was fully oriented at the time of the current evaluation.  Intellectual abilities based upon educational and vocational attainment were estimated to be in the average range. Premorbid abilities were estimated to be within the below average range based upon a single-word reading test.   Processing speed was average to above average outside of an isolated weakness across a rapid numerical sequencing task. Basic attention was average. More complex attention (e.g., working memory) was also average. Executive functioning was average. Performance was above average across a task assessing safety and judgment.  While not directly assessed, receptive language abilities were believed to be intact. Mr. Mcneish did not exhibit any difficulties comprehending task instructions and answered all questions asked of him appropriately. Assessed expressive language was variable. Phonemic fluency was well below average to below average, semantic fluency was average, and confrontation naming was well below average.     Assessed visuospatial/visuoconstructional abilities were average to above average.    Learning (i.e., encoding) of novel verbal and visual information was variable, ranging from the  well below average to well above average normative ranges. Spontaneous delayed recall (i.e., retrieval) of previously  learned information was well below average across a list learning task but average to above average across all other memory tasks. Retention rates were 100% across a story learning task, 133% across a list learning task, 94% across a daily living task, and 78% across a shape learning task. Performance across recognition tasks was variable, ranging from the exceptionally low to average normative ranges, suggesting some evidence for information consolidation.   Results of emotional screening instruments suggested that recent symptoms of generalized anxiety were in the minimal range, while symptoms of depression were within normal limits. A screening instrument assessing recent sleep quality suggested the presence of minimal sleep dysfunction.  Tables of Scores:   Note: This summary of test scores accompanies the interpretive report and should not be considered in isolation without reference to the appropriate sections in the text. Descriptors are based on appropriate normative data and may be adjusted based on clinical judgment. Terms such as "Within Normal Limits" and "Outside Normal Limits" are used when a more specific description of the test score cannot be determined.       Percentile - Normative Descriptor > 98 - Exceptionally High 91-97 - Well Above Average 75-90 - Above Average 25-74 - Average 9-24 - Below Average 2-8 - Well Below Average < 2 - Exceptionally Low       Validity:   DESCRIPTOR       DCT: --- --- Within Normal Limits  NAB EVI: --- --- Outside Normal Limits  D-KEFS Color Word EI: --- --- Within Normal Limits       Orientation:      Raw Score Percentile   NAB Orientation, Form 1 29/29 --- ---       Cognitive Screening:      Raw Score Percentile   SLUMS: 21/30 --- ---       Intellectual Functioning:      Standard Score Percentile   Test of Premorbid  Functioning: 89 23 Below Average       Memory:     NAB Memory Module, Form 1: Standard Score/ T Score Percentile   Total Memory Index 94 34 Average  List Learning       Total Trials 1-3 16/36 (36) 8 Well Below Average    List B 4/12 (48) 42 Average    Short Delay Free Recall 3/12 (30) 2 Well Below Average    [place] Delay Free Recall 4/12 (35) 7 Well Below Average    Retention Percentage 133 (62) 88 Above Average    Recognition Discriminability -5 (<19) <1 Exceptionally Low  Shape Learning       Total Trials 1-3 22/27 (68) 96 Well Above Average    Delayed Recall 7/9 (60) 84 Above Average    Retention Percentage 78 (43) 25 Average    Recognition Discriminability 7 (52) 58 Average  Story Learning       Immediate Recall 53/80 (42) 21 Below Average    Delayed Recall 31/40 (48) 42 Average    Retention Percentage 100 (53) 62 Average  Daily Living Memory       Immediate Recall 41/51 (48) 42 Average    Delayed Recall 16/17 (62) 88 Above Average    Retention Percentage 94 (56) 73 Average    Recognition Hits 6/10 (28) 2 Well Below Average       Attention/Executive Function:     Trail Making Test (TMT): Raw Score (T Score) Percentile     Part A 54 secs.,  0 errors (34) 5 Well Below  Average    Part B 88 secs.,  0 errors (51) 54 Average         Scaled Score Percentile   WAIS-IV Coding: 11 63 Average       NAB Attention Module, Form 1: T Score Percentile     Digits Forward 44 27 Average    Digits Backwards 50 50 Average       D-KEFS Color-Word Interference Test: Raw Score (Scaled Score) Percentile     Color Naming 31 secs. (11) 63 Average    Word Reading 22 secs. (12) 75 Above Average    Inhibition 72 secs. (10) 50 Average      Total Errors 1 error (12) 75 Above Average    Inhibition/Switching 75 secs. (10) 50 Average      Total Errors 2 errors (11) 63 Average       D-KEFS Verbal Fluency Test: Raw Score (Scaled Score) Percentile     Letter Total Correct 24 (7) 16 Below Average     Category Total Correct 34 (10) 50 Average    Category Switching Total Correct 11 (9) 37 Average    Category Switching Accuracy 9 (8) 25 Average      Total Set Loss Errors 3 (9) 37 Average      Total Repetition Errors 3 (10) 50 Average       NAB Executive Functions Module, Form 1: T Score Percentile     Judgment 58 79 Above Average       Language:     Verbal Fluency Test: Raw Score (T Score) Percentile     Phonemic Fluency (FAS) 24 (34) 5 Well Below Average    Animal Fluency 16 (43) 25 Average        NAB Language Module, Form 1: T Score Percentile     Naming 28/31 (35) 7 Well Below Average       Visuospatial/Visuoconstruction:      Raw Score Percentile   Clock Drawing: 9/10 --- Within Normal Limits       NAB Spatial Module, Form 1: T Score Percentile     Figure Drawing Copy 57 75 Above Average        Scaled Score Percentile   WAIS-IV Block Design: 11 63 Average  WAIS-IV Matrix Reasoning: 11 63 Average       Mood and Personality:      Raw Score Percentile   Beck Depression Inventory - II: 7 --- Within Normal Limits  PROMIS Anxiety Questionnaire: 14 --- None to Slight       Additional Questionnaires:      Raw Score Percentile   PROMIS Sleep Disturbance Questionnaire: 16 --- None to Slight   Informed Consent and Coding/Compliance:   The current evaluation represents a clinical evaluation for the purposes previously outlined by the referral source and is in no way reflective of a forensic evaluation.   Mr. Goodine was provided with a verbal description of the nature and purpose of the present neuropsychological evaluation. Also reviewed were the foreseeable risks and/or discomforts and benefits of the procedure, limits of confidentiality, and mandatory reporting requirements of this provider. The patient was given the opportunity to ask questions and receive answers about the evaluation. Oral consent to participate was provided by the patient.   This evaluation was conducted by  Newman Nickels, Ph.D., ABPP-CN, board certified clinical neuropsychologist. Mr. Fouse completed a clinical interview with Dr. Milbert Coulter, billed as one unit [phone], and 140 minutes of cognitive testing and scoring, billed as one  unit [id number] and four additional units [id number]. Psychometrist Shan Levans, B.S. assisted Dr. Milbert Coulter with test administration and scoring procedures. As a separate and discrete service, one unit [id number] and two units [phone] were billed for Dr. Tammy Sours time spent in interpretation and report writing.

## 2023-07-04 NOTE — Progress Notes (Signed)
   Psychometrician Note   Cognitive testing was administered to [name] by Shan Levans, B.S. (psychometrist) under the supervision of Dr. Newman Nickels, Ph.D., licensed psychologist on 07/04/2023. [name] did not appear overtly distressed by the testing session per behavioral observation or responses across self-report questionnaires. Rest breaks were offered.    The battery of tests administered was selected by Dr. Newman Nickels, Ph.D. with consideration to [name] current level of functioning, the nature of his symptoms, emotional and behavioral responses during interview, level of literacy, observed level of motivation/effort, and the nature of the referral question. This battery was communicated to the psychometrist. Communication between Dr. Newman Nickels, Ph.D. and the psychometrist was ongoing throughout the evaluation and Dr. Newman Nickels, Ph.D. was immediately accessible at all times. Dr. Newman Nickels, Ph.D. provided supervision to the psychometrist on the date of this service to the extent necessary to assure the quality of all services provided.    [name] will return within approximately 1-2 weeks for an interactive feedback session with Dr. Milbert Coulter at which time his test performances, clinical impressions, and treatment recommendations will be reviewed in detail. [name] understands he can contact our office should he require our assistance before this time.  A total of 140 minutes of billable time were spent face-to-face with [name] by the psychometrist. This includes both test administration and scoring time. Billing for these services is reflected in the clinical report generated by Dr. Newman Nickels, Ph.D.  This note reflects time spent with the psychometrician and does not include test scores or any clinical interpretations made by Dr. Milbert Coulter. The full report will follow in a separate note.

## 2023-07-13 ENCOUNTER — Ambulatory Visit (INDEPENDENT_AMBULATORY_CARE_PROVIDER_SITE_OTHER): Payer: Medicare Other | Admitting: Psychology

## 2023-07-13 DIAGNOSIS — G039 Meningitis, unspecified: Secondary | ICD-10-CM

## 2023-07-13 DIAGNOSIS — G3184 Mild cognitive impairment, so stated: Secondary | ICD-10-CM

## 2023-07-13 NOTE — Progress Notes (Signed)
   Neuropsychology Feedback Session [name]. [facility] Department of Neurology  Reason for Referral:   DUC [name] is a 72 y.o. right-handed Caucasian male referred by Brian Kays, PA-C, to characterize his current cognitive functioning and assist with diagnostic clarity and treatment planning in the context of subjective cognitive decline.   Feedback:   Mr. [name] completed a comprehensive neuropsychological evaluation on 10/03/2022. Please refer to that encounter for the full report and recommendations. Briefly, results suggested isolated deficits across phonemic fluency and confrontation naming, as well as performance variability across verbal memory. Current testing patterns do not offer strongly compelling evidence to suggest underlying Alzheimer's disease. This will remain important to monitor this over time to assess progressive decline versus ongoing stability. At the present time, the more likely explanation for current weaknesses across testing and day-to-day dysfunction would appear to be multifactorial in nature. Medically, [name] has a history of two separate instances of meningitis (suspected bacterial in 2020 and viral in 2022), various cardiovascular ailments (i.e., hypertension, hyperlipidemia, type II diabetes), and prior neuroimaging suggesting moderate microvascular ischemic disease. All of which, especially when combined, can certainly create inefficiencies with encoding aspects of memory, as well as trouble with spontaneous information retrieval. Ongoing hearing loss, psychiatric distress surrounding anxiety, and various day-to-day stressors would certainly compound ongoing difficulties.   [name] was accompanied by his wife during the current feedback session. Content of the current session focused on the results of his neuropsychological evaluation. [name] was given the opportunity to ask questions and his questions were answered. He was encouraged to  reach out should additional questions arise. A copy of his report was provided at the conclusion of the visit.      One unit [phone] was billed for Dr. Tammy Sours time spent preparing for, conducting, and documenting the current feedback session with [name].

## 2023-09-20 ENCOUNTER — Ambulatory Visit (INDEPENDENT_AMBULATORY_CARE_PROVIDER_SITE_OTHER): Payer: Medicare PPO | Admitting: Physician Assistant

## 2023-09-20 ENCOUNTER — Encounter: Payer: Self-pay | Admitting: Physician Assistant

## 2023-09-20 VITALS — BP 121/85 | HR 82 | Resp 18 | Ht 70.0 in | Wt 212.0 lb

## 2023-09-20 DIAGNOSIS — G3184 Mild cognitive impairment, so stated: Secondary | ICD-10-CM

## 2023-09-20 NOTE — Progress Notes (Signed)
 Assessment/Plan:   Mild cognitive impairment, likely multifactorial  [name] is a very pleasant 73 y.o. RH male with a history of history of hypertension, hyperlipidemia, meningitis twice in 5 years, vitamin D deficiency, pre-diabetes, decreased hearing, anxiety and a diagnosis of mild cognitive impairment, likely multifactorial without strong evidence for Alzheimer's disease per neuropsych evaluation October 2024, presenting today in follow-up for evaluation of memory concerns.  He denies any worsening of his memory.  Patient was on donepezil  10 mg nightly, but after experiencing increased amount of dreams, he discontinued the medicine.  We discussed restarting the medication but this time in the early morning and monitor.  If he continues to experience these dreams, will consider other options.  He is able to participate on his ADLs without difficulty, and he continues to drive.  He has cut down on the days of work, which has improved his mood, he feels less anxious.  We discussed the role of psychotherapy, but he has not made a decision about it.     Recommendations:   Follow up in 6 months. Repeat neuropsych evaluation in 16 to 20 months for diagnostic clarity Recommend psychotherapy for high anxiety Restart donepezil  10 mg in a.m., side effects discussed Continue vitamin B12 replenishment and vitamin D supplements Recommend good control of cardiovascular risk factors Continue to control mood as per PCP    Subjective:   This patient is accompanied in the office by his wife who supplements the history. Previous records as well as any outside records available were reviewed prior to todays visit.   Patient was last seen on 06/28/2023 with MoCA of 22/30.    Any changes in memory since last visit? "About the same". He does admit that decreased hearing provokes him missing some elements in conversations.  He reports some difficulty remembering new information, recent conversations.   He continues to work in a high stress environment but he has cut down to twice a week. He has never been able to remember names well.  As before, he does admit that sometimes he says something without thinking it through. repeats oneself?  Endorsed Disoriented when walking into a room?  Patient denies   Misplacing objects?  Patient denies   Wandering behavior?   denies   Any personality changes since last visit?  As before, "he does not know how to relax,butt he is trying ".  He reports himself being "OCD ".  He admits to have short temper.  He is yet to have psychotherapy.   Any worsening depression?: denies   Hallucinations or paranoia?  denies   Seizures?   denies    Any sleep changes? 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  When on donepezil , he was experiencing "wild dreams "so he stopped taking it with resolution of the symptoms.  Denies REM behavior or sleepwalking   Sleep apnea?   denies    Any hygiene concerns?   denies   Independent of bathing and dressing?  Endorsed  Does the patient needs help with medications? Patient is in charge   Who is in charge of the finances?  Patient is in charge     Any changes in appetite?  Denies "recent death got MRSA from dental implant treated with antibiotics, during that time and limited the consistency of the food" Patient have trouble swallowing?  denies   Does the patient cook?  Any kitchen accidents such as leaving the stove on?   denies  Any headaches?   During the time he had MRSA from dental implant, he had some headache initially thinking that this was the third time meningitis, although this was completely ruled out. Vision changes? denies Chronic pain?  denies   Ambulates with difficulty?   He exercises with a trainer at home, and they walk weather permitting   Recent falls or head injuries?    denies      Unilateral weakness, numbness or tingling?   denies   Any tremors?  denies   Any anosmia?    denies   Any  incontinence of urine?  denies   Any bowel dysfunction?  He has intermittent diarrhea attributed to metformin     Patient lives with his wife  Does the patient drive?  Yes, denies getting lost or any other issues   MRI of the brain, personally reviewed, October 2024 remarkable for moderate chronic small vessel ischemic changes in the cerebral white matter, mild in the pons, mild to moderate generalized cerebral atrophy, mild cerebellar atrophy.  Labs September 2024 B12 366, TSH 1.25  Neuropsych evaluation 07/04/2023 briefly, results suggested isolated deficits across phonemic fluency and confrontation naming, as well as performance variability across verbal memory. Current testing patterns do not offer strongly compelling evidence to suggest underlying Alzheimer's disease. This will remain important to monitor this over time to assess progressive decline versus ongoing stability. At the present time, the more likely explanation for current weaknesses across testing and day-to-day dysfunction would appear to be multifactorial in nature. Medically, Mr. Bartos has a history of two separate instances of meningitis (suspected bacterial in 2020 and viral in 2022), various cardiovascular ailments (i.e., hypertension, hyperlipidemia, type II diabetes), and prior neuroimaging suggesting moderate microvascular ischemic disease. All of which, especially when combined, can certainly create inefficiencies with encoding aspects of memory, as well as trouble with spontaneous information retrieval. Ongoing hearing loss, psychiatric distress surrounding anxiety, and various day-to-day stressors would certainly compound ongoing difficulties.   Past Medical History:  Diagnosis Date   Abnormal ECG 12/24/2014   Early repolarization     Cerebrovascular disease    Moderate per MRI    Controlled type 2 diabetes mellitus without complication, without Blandon-term current use of insulin  04/07/2014   Esophageal dysphagia  01/05/2018   Essential hypertension 05/21/2013   Generalized anxiety disorder 05/21/2013   w/ OCD tendencies   Hearing loss    History of adenomatous polyp of colon    Jaw pain    Left ear pain 12/01/2020   Lumbar spondylosis    Meningitis    2020-bacterial; 2022-viral   Mild cognitive impairment of uncertain or unknown etiology 07/04/2023   Mixed hyperlipidemia 05/21/2013   Pure hypercholesterolemia 01/05/2018   Sinusitis 10/06/2018   Squamous cell carcinoma of skin 09/05/2019   right posterior ear - tx after biopsy   Vitamin D deficiency      Past Surgical History:  Procedure Laterality Date   BACK SURGERY     COLONOSCOPY  07/21/2004, 04/03/2010   EYE MUSCLE REPAIR     HERNIA REPAIR  1962   LUMBAR FUSION  04/17/2009   L4-L5   LUMBAR FUSION  1992   L4-L5   SKIN BIOPSY Right 09/05/2019   right posterior ear - tx after biopsy     PREVIOUS MEDICATIONS:   CURRENT MEDICATIONS:  Outpatient Encounter Medications as of 09/20/2023  Medication Sig   acetaminophen  (TYLENOL ) 500 MG tablet Take 1,000 mg by mouth 2 (two) times daily as needed for mild  pain.   azelastine (ASTELIN) 0.1 % nasal spray Place 1 spray into both nostrils 2 (two) times daily.    cholecalciferol (VITAMIN D) 1000 UNITS tablet Take 1,000 Units by mouth daily.     cyanocobalamin  (VITAMIN B12) 1000 MCG tablet Take 1,000 mcg by mouth daily.   LORazepam  (ATIVAN ) 0.5 MG tablet Take 0.5 mg by mouth at bedtime.   metFORMIN (GLUCOPHAGE) 500 MG tablet Take 500 mg by mouth 2 (two) times daily with a meal.   Multiple Vitamin (MULTIVITAMIN WITH MINERALS) TABS tablet Take 1 tablet by mouth daily.   omeprazole (PRILOSEC) 20 MG capsule Take 20 mg by mouth daily.   rosuvastatin (CRESTOR) 10 MG tablet Take 10 mg by mouth daily.   donepezil  (ARICEPT ) 10 MG tablet Take half tablet (5 mg) daily for 2 weeks, then increase to one  tablet at 10 mg daily (Patient not taking: Reported on 09/20/2023)   FLUoxetine (PROZAC) 10 MG capsule  Take 40 capsules by mouth daily.   fluticasone (FLONASE) 50 MCG/ACT nasal spray Place 1 spray into both nostrils daily. (Patient not taking: Reported on 06/05/2023)   ibuprofen (ADVIL,MOTRIN) 800 MG tablet Take 800 mg by mouth 3 (three) times daily as needed for moderate pain. (Patient not taking: Reported on 06/05/2023)   S-Adenosylmethionine (SAM-E) 400 MG TABS Take 1 tablet by mouth daily.   (Patient not taking: Reported on 06/05/2023)   No facility-administered encounter medications on file as of 09/20/2023.     Objective:     PHYSICAL EXAMINATION:    VITALS:   Vitals:   09/20/23 0951  BP: 121/85  Pulse: 82  Resp: 18  SpO2: 95%  Weight: 212 lb (96.2 kg)  Height: 5\' 10"  (1.778 m)    GEN:  The patient appears stated age and is in NAD. HEENT:  Normocephalic, atraumatic.   Neurological examination:  General: NAD, well-groomed, appears stated age. Orientation: The patient is alert. Oriented to person, place and date Cranial nerves: There is good facial symmetry.The speech is fluent and clear. No aphasia or dysarthria. Fund of knowledge is appropriate. Recent memory impaired and remote memory is normal.  Attention and concentration are reduced.  Able to name objects and repeat phrases.  Hearing is decreased to conversational tone  Sensation: Sensation is intact to light touch throughout Motor: Strength is at least antigravity x4. DTR's 2/4 in UE/LE      06/05/2023   12:00 PM  Montreal Cognitive Assessment   Visuospatial/ Executive (0/5) 0  Naming (0/3) 3  Attention: Read list of digits (0/2) 2  Attention: Read list of letters (0/1) 1  Attention: Serial 7 subtraction starting at 100 (0/3) 2  Language: Repeat phrase (0/2) 1  Language : Fluency (0/1) 1  Abstraction (0/2) 1  Delayed Recall (0/5) 5  Orientation (0/6) 6  Total 22  Adjusted Score (based on education) 22        No data to display             Movement examination: Tone: There is normal tone in the  UE/LE Abnormal movements:  no tremor.  No myoclonus.  No asterixis.   Coordination:  There is no decremation with RAM's. Normal finger to nose  Gait and Station: The patient has no difficulty arising out of a deep-seated chair without the use of the hands. The patient's stride length is good.  Gait is cautious and narrow.   Thank you for allowing us  the opportunity to participate in the care of this nice  patient. Please do not hesitate to contact us  for any questions or concerns.   Total time spent on today's visit was 32 minutes dedicated to this patient today, preparing to see patient, examining the patient, ordering tests and/or medications and counseling the patient, documenting clinical information in the EHR or other health record, independently interpreting results and communicating results to the patient/family, discussing treatment and goals, answering patient's questions and coordinating care.  Cc:  Veola Giovanni [facility] 09/20/2023 12:15 PM

## 2023-09-20 NOTE — Patient Instructions (Signed)
 It was a pleasure to see you today at our office.   Recommendations:   Follow up in 6 months  Restart Donepezil  10 mg :Take half tablet (5 mg) daily for 2 weeks, then increase to the full tablet at 10 mg daily. Take it during the day  Recommend referral to psychology, situational depression, anxiety and attention issues  Neuropsych testing for clarity of diagnosis and disease trajectory   Continue using hearing aids Continue Replenish B12  at 1000 mcg daily   For psychiatric meds, mood meds: Please have your primary care physician manage these medications.  If you have any severe symptoms of a stroke, or other severe issues such as confusion,severe chills or fever, etc call 911 or go to the ER as you may need to be evaluated further     For assessment of decision of mental capacity and competency:  Call Dr. Laverne Potter, geriatric psychiatrist at [phone]  Counseling regarding caregiver distress, including caregiver depression, anxiety and issues regarding community resources, adult day care programs, adult living facilities, or memory care questions:  please contact your  Primary Doctor's Social Worker   Whom to call: Memory  decline, memory medications: Call our office [phone]    [email or web address]   RECOMMENDATIONS FOR ALL PATIENTS WITH MEMORY PROBLEMS: 1. Continue to exercise (Recommend 30 minutes of walking everyday, or 3 hours every week) 2. Increase social interactions - continue going to [place] and enjoy social gatherings with friends and family 3. Eat healthy, avoid fried foods and eat more fruits and vegetables 4. Maintain adequate blood pressure, blood sugar, and blood cholesterol level. Reducing the risk of stroke and cardiovascular disease also helps promoting better memory. 5. Avoid stressful situations. Live a simple life and avoid aggravations. Organize your time and prepare for the next day in  anticipation. 6. Sleep well, avoid any interruptions of sleep and avoid any distractions in the bedroom that may interfere with adequate sleep quality 7. Avoid sugar, avoid sweets as there is a strong link between excessive sugar intake, diabetes, and cognitive impairment We discussed the Mediterranean diet, which has been shown to help patients reduce the risk of progressive memory disorders and reduces cardiovascular risk. This includes eating fish, eat fruits and green leafy vegetables, nuts like almonds and hazelnuts, walnuts, and also use olive oil. Avoid fast foods and fried foods as much as possible. Avoid sweets and sugar as sugar use has been linked to worsening of memory function.  There is always a concern of gradual progression of memory problems. If this is the case, then we may need to adjust level of care according to patient needs. Support, both to the patient and caregiver, should then be put into place.           DRIVING: Regarding driving, in patients with progressive memory problems, driving will be impaired. We advise to have someone else do the driving if trouble finding directions or if minor accidents are reported. Independent driving assessment is available to determine safety of driving.   If you are interested in the driving assessment, you can contact the following:  [employer] in [place] [phone]  [facility] [phone]  [facility] [phone]  [facility] [phone] or [phone]   FALL PRECAUTIONS: Be cautious when walking. Scan the area for obstacles that may increase the risk of trips and falls. When getting up in the mornings, sit up at the edge of the bed for a few minutes before getting out of  bed. Consider elevating the bed at the head end to avoid drop of blood pressure when getting up. Walk always in a well-lit room (use night lights in the walls). Avoid area rugs or power cords from  appliances in the middle of the walkways. Use a walker or a cane if necessary and consider physical therapy for balance exercise. Get your eyesight checked regularly.  FINANCIAL OVERSIGHT: Supervision, especially oversight when making financial decisions or transactions is also recommended.  HOME SAFETY: Consider the safety of the kitchen when operating appliances like stoves, microwave oven, and blender. Consider having supervision and share cooking responsibilities until no longer able to participate in those. Accidents with firearms and other hazards in the house should be identified and addressed as well.   ABILITY TO BE LEFT ALONE: If patient is unable to contact 911 operator, consider using LifeLine, or when the need is there, arrange for someone to stay with patients. Smoking is a fire hazard, consider supervision or cessation. Risk of wandering should be assessed by caregiver and if detected at any point, supervision and safe proof recommendations should be instituted.  MEDICATION SUPERVISION: Inability to self-administer medication needs to be constantly addressed. Implement a mechanism to ensure safe administration of the medications.      Mediterranean Diet A Mediterranean diet refers to food and lifestyle choices that are based on the traditions of countries located on the [employer] [place]. This way of eating has been shown to help prevent certain conditions and improve outcomes for people who have chronic diseases, like kidney disease and heart disease. What are tips for following this plan? Lifestyle  Cook and eat meals together with your family, when possible. Drink enough fluid to keep your urine clear or pale yellow. Be physically active every day. This includes: Aerobic exercise like running or swimming. Leisure activities like gardening, walking, or housework. Get 7-8 hours of sleep each night. If recommended by your health care provider, drink red wine in moderation.  This means 1 glass a day for nonpregnant women and 2 glasses a day for men. A glass of wine equals 5 oz (150 mL). Reading food labels  Check the serving size of packaged foods. For foods such as rice and pasta, the serving size refers to the amount of cooked product, not dry. Check the total fat in packaged foods. Avoid foods that have saturated fat or trans fats. Check the ingredients list for added sugars, such as corn syrup. Shopping  At the grocery store, buy most of your food from the areas near the walls of the store. This includes: Fresh fruits and vegetables (produce). Grains, beans, nuts, and seeds. Some of these may be available in unpackaged forms or large amounts (in bulk). Fresh seafood. Poultry and eggs. Low-fat dairy products. Buy whole ingredients instead of prepackaged foods. Buy fresh fruits and vegetables in-season from local farmers markets. Buy frozen fruits and vegetables in resealable bags. If you do not have access to quality fresh seafood, buy precooked frozen shrimp or canned fish, such as tuna, salmon, or sardines. Buy small amounts of raw or cooked vegetables, salads, or olives from the deli or salad bar at your store. Stock your pantry so you always have certain foods on hand, such as olive oil, canned tuna, canned tomatoes, rice, pasta, and beans. Cooking  Cook foods with extra-virgin olive oil instead of using butter or other vegetable oils. Have meat as a side dish, and have vegetables or grains as your main dish. This means having  meat in small portions or adding small amounts of meat to foods like pasta or stew. Use beans or vegetables instead of meat in common dishes like chili or lasagna. Experiment with different cooking methods. Try roasting or broiling vegetables instead of steaming or sauteing them. Add frozen vegetables to soups, stews, pasta, or rice. Add nuts or seeds for added healthy fat at each meal. You can add these to yogurt, salads, or  vegetable dishes. Marinate fish or vegetables using olive oil, lemon juice, garlic, and fresh herbs. Meal planning  Plan to eat 1 vegetarian meal one day each week. Try to work up to 2 vegetarian meals, if possible. Eat seafood 2 or more times a week. Have healthy snacks readily available, such as: Vegetable sticks with hummus. Greek yogurt. Fruit and nut trail mix. Eat balanced meals throughout the week. This includes: Fruit: 2-3 servings a day Vegetables: 4-5 servings a day Low-fat dairy: 2 servings a day Fish, poultry, or lean meat: 1 serving a day Beans and legumes: 2 or more servings a week Nuts and seeds: 1-2 servings a day Whole grains: 6-8 servings a day Extra-virgin olive oil: 3-4 servings a day Limit red meat and sweets to only a few servings a month What are my food choices? Mediterranean diet Recommended Grains: Whole-grain pasta. Brown rice. Bulgar wheat. Polenta. Couscous. Whole-wheat bread. [place] Glad. Vegetables: Artichokes. Beets. Broccoli. Cabbage. Carrots. Eggplant. Green beans. Chard. Kale. Spinach. Onions. Leeks. Peas. Squash. Tomatoes. Peppers. Radishes. Fruits: Apples. Apricots. Avocado. Berries. Bananas. Cherries. Dates. Figs. Grapes. Lemons. Melon. Oranges. Peaches. Plums. Pomegranate. Meats and other protein foods: Beans. Almonds. Sunflower seeds. Pine nuts. Peanuts. Cod. Salmon. Scallops. Shrimp. Tuna. Tilapia. Clams. Oysters. Eggs. Dairy: Low-fat milk. Cheese. Greek yogurt. Beverages: Water. Red wine. Herbal tea. Fats and oils: Extra virgin olive oil. Avocado oil. Grape seed oil. Sweets and desserts: [place] yogurt with honey. Baked apples. Poached pears. Trail mix. Seasoning and other foods: Basil. Cilantro. Coriander. Cumin. Mint. Parsley. Sage. Rosemary. Tarragon. Garlic. Oregano. Thyme. Pepper. Balsalmic vinegar. Tahini. Hummus. Tomato sauce. Olives. Mushrooms. Limit these Grains: Prepackaged pasta or rice dishes. Prepackaged cereal with added  sugar. Vegetables: Deep fried potatoes (french fries). Fruits: Fruit canned in syrup. Meats and other protein foods: Beef. Pork. Lamb. Poultry with skin. Hot dogs. Helene Loader. Dairy: Ice cream. Sour cream. Whole milk. Beverages: Juice. Sugar-sweetened soft drinks. Beer. Liquor and spirits. Fats and oils: Butter. Canola oil. Vegetable oil. Beef fat (tallow). Lard. Sweets and desserts: Cookies. Cakes. Pies. Candy. Seasoning and other foods: Mayonnaise. Premade sauces and marinades. The items listed may not be a complete list. Talk with your dietitian about what dietary choices are right for you. Summary The Mediterranean diet includes both food and lifestyle choices. Eat a variety of fresh fruits and vegetables, beans, nuts, seeds, and whole grains. Limit the amount of red meat and sweets that you eat. Talk with your health care provider about whether it is safe for you to drink red wine in moderation. This means 1 glass a day for nonpregnant women and 2 glasses a day for men. A glass of wine equals 5 oz (150 mL). This information is not intended to replace advice given to you by your health care provider. Make sure you discuss any questions you have with your health care provider. Document Released: 04/14/2016 Document Revised: 05/17/2016 Document Reviewed: 04/14/2016 Elsevier Interactive Patient Education  2017 [employer].      [facility] [phone]. Labs today [address]

## 2023-12-29 ENCOUNTER — Encounter: Payer: Self-pay | Admitting: Physician Assistant

## 2024-02-29 ENCOUNTER — Ambulatory Visit: Admitting: Physician Assistant

## 2024-02-29 ENCOUNTER — Encounter: Payer: Self-pay | Admitting: Physician Assistant

## 2024-02-29 VITALS — BP 114/86 | HR 75 | Resp 20 | Ht 70.0 in | Wt 214.0 lb

## 2024-02-29 DIAGNOSIS — G3184 Mild cognitive impairment, so stated: Secondary | ICD-10-CM | POA: Diagnosis not present

## 2024-02-29 DIAGNOSIS — R413 Other amnesia: Secondary | ICD-10-CM

## 2024-02-29 MED ORDER — DONEPEZIL HCL 10 MG PO TABS: ORAL_TABLET | ORAL | 11 refills | Status: AC

## 2024-02-29 NOTE — Patient Instructions (Addendum)
 It was a pleasure to see you today at our office.   Recommendations:   Follow up in Dec 24 at 9 am Restart Donepezil  10 mg :Take half tablet (5 mg) daily for 2 weeks, then increase to the full tablet at 10 mg daily. Take it during the day Continue using hearing aids Continue Replenish B12  at 1000 mcg daily    Counseling regarding caregiver distress, including caregiver depression, anxiety and issues regarding community resources, adult day care programs, adult living facilities, or memory care questions:  please contact your  Primary Doctor's Social Worker   Whom to call:v Memory  decline, memory medications: Call our office [phone]    [email or web address]   RECOMMENDATIONS FOR ALL PATIENTS WITH MEMORY PROBLEMS: 1. Continue to exercise (Recommend 30 minutes of walking everyday, or 3 hours every week) 2. Increase social interactions - continue going to [place] and enjoy social gatherings with friends and family 3. Eat healthy, avoid fried foods and eat more fruits and vegetables 4. Maintain adequate blood pressure, blood sugar, and blood cholesterol level. Reducing the risk of stroke and cardiovascular disease also helps promoting better memory. 5. Avoid stressful situations. Live a simple life and avoid aggravations. Organize your time and prepare for the next day in anticipation. 6. Sleep well, avoid any interruptions of sleep and avoid any distractions in the bedroom that may interfere with adequate sleep quality 7. Avoid sugar, avoid sweets as there is a strong link between excessive sugar intake, diabetes, and cognitive impairment We discussed the Mediterranean diet, which has been shown to help patients reduce the risk of progressive memory disorders and reduces cardiovascular risk. This includes eating fish, eat fruits and green leafy vegetables, nuts like almonds and hazelnuts, walnuts, and also use olive oil. Avoid fast  foods and fried foods as much as possible. Avoid sweets and sugar as sugar use has been linked to worsening of memory function.  There is always a concern of gradual progression of memory problems. If this is the case, then we may need to adjust level of care according to patient needs. Support, both to the patient and caregiver, should then be put into place.           DRIVING: Regarding driving, in patients with progressive memory problems, driving will be impaired. We advise to have someone else do the driving if trouble finding directions or if minor accidents are reported. Independent driving assessment is available to determine safety of driving.   If you are interested in the driving assessment, you can contact the following:  [employer] in [place] [phone]  [facility] [phone]  [facility] [phone]  [facility] [phone] or [phone]   FALL PRECAUTIONS: Be cautious when walking. Scan the area for obstacles that may increase the risk of trips and falls. When getting up in the mornings, sit up at the edge of the bed for a few minutes before getting out of bed. Consider elevating the bed at the head end to avoid drop of blood pressure when getting up. Walk always in a well-lit room (use night lights in the walls). Avoid area rugs or power cords from appliances in the middle of the walkways. Use a walker or a cane if necessary and consider physical therapy for balance exercise. Get your eyesight checked regularly.  FINANCIAL OVERSIGHT: Supervision, especially oversight when making financial decisions or transactions is also recommended.  HOME SAFETY: Consider the safety of the kitchen when operating appliances like stoves, microwave  oven, and blender. Consider having supervision and share cooking responsibilities until no longer able to participate in those. Accidents with firearms and other hazards in the house  should be identified and addressed as well.   ABILITY TO BE LEFT ALONE: If patient is unable to contact 911 operator, consider using LifeLine, or when the need is there, arrange for someone to stay with patients. Smoking is a fire hazard, consider supervision or cessation. Risk of wandering should be assessed by caregiver and if detected at any point, supervision and safe proof recommendations should be instituted.  MEDICATION SUPERVISION: Inability to self-administer medication needs to be constantly addressed. Implement a mechanism to ensure safe administration of the medications.      Mediterranean Diet A Mediterranean diet refers to food and lifestyle choices that are based on the traditions of countries located on the [place]. This way of eating has been shown to help prevent certain conditions and improve outcomes for people who have chronic diseases, like kidney disease and heart disease. What are tips for following this plan? Lifestyle  Cook and eat meals together with your family, when possible. Drink enough fluid to keep your urine clear or pale yellow. Be physically active every day. This includes: Aerobic exercise like running or swimming. Leisure activities like gardening, walking, or housework. Get 7-8 hours of sleep each night. If recommended by your health care provider, drink red wine in moderation. This means 1 glass a day for nonpregnant women and 2 glasses a day for men. A glass of wine equals 5 oz (150 mL). Reading food labels  Check the serving size of packaged foods. For foods such as rice and pasta, the serving size refers to the amount of cooked product, not dry. Check the total fat in packaged foods. Avoid foods that have saturated fat or trans fats. Check the ingredients list for added sugars, such as corn syrup. Shopping  At the grocery store, buy most of your food from the areas near the walls of the store. This includes: Fresh fruits and vegetables  (produce). Grains, beans, nuts, and seeds. Some of these may be available in unpackaged forms or large amounts (in bulk). Fresh seafood. Poultry and eggs. Low-fat dairy products. Buy whole ingredients instead of prepackaged foods. Buy fresh fruits and vegetables in-season from local farmers markets. Buy frozen fruits and vegetables in resealable bags. If you do not have access to quality fresh seafood, buy precooked frozen shrimp or canned fish, such as tuna, salmon, or sardines. Buy small amounts of raw or cooked vegetables, salads, or olives from the deli or salad bar at your store. Stock your pantry so you always have certain foods on hand, such as olive oil, canned tuna, canned tomatoes, rice, pasta, and beans. Cooking  Cook foods with extra-virgin olive oil instead of using butter or other vegetable oils. Have meat as a side dish, and have vegetables or grains as your main dish. This means having meat in small portions or adding small amounts of meat to foods like pasta or stew. Use beans or vegetables instead of meat in common dishes like chili or lasagna. Experiment with different cooking methods. Try roasting or broiling vegetables instead of steaming or sauteing them. Add frozen vegetables to soups, stews, pasta, or rice. Add nuts or seeds for added healthy fat at each meal. You can add these to yogurt, salads, or vegetable dishes. Marinate fish or vegetables using olive oil, lemon juice, garlic, and fresh herbs. Meal planning  Plan to  eat 1 vegetarian meal one day each week. Try to work up to 2 vegetarian meals, if possible. Eat seafood 2 or more times a week. Have healthy snacks readily available, such as: Vegetable sticks with hummus. Greek yogurt. Fruit and nut trail mix. Eat balanced meals throughout the week. This includes: Fruit: 2-3 servings a day Vegetables: 4-5 servings a day Low-fat dairy: 2 servings a day Fish, poultry, or lean meat: 1 serving a day Beans and  legumes: 2 or more servings a week Nuts and seeds: 1-2 servings a day Whole grains: 6-8 servings a day Extra-virgin olive oil: 3-4 servings a day Limit red meat and sweets to only a few servings a month What are my food choices? Mediterranean diet Recommended Grains: Whole-grain pasta. Brown rice. Bulgar wheat. Polenta. Couscous. Whole-wheat bread. [place]. Vegetables: Artichokes. Beets. Broccoli. Cabbage. Carrots. Eggplant. Green beans. Chard. Kale. Spinach. Onions. Leeks. Peas. Squash. Tomatoes. Peppers. Radishes. Fruits: Apples. Apricots. Avocado. Berries. Bananas. Cherries. Dates. Figs. Grapes. Lemons. Melon. Oranges. Peaches. Plums. Pomegranate. Meats and other protein foods: Beans. Almonds. Sunflower seeds. Pine nuts. Peanuts. Cod. Salmon. Scallops. Shrimp. Tuna. Tilapia. Clams. Oysters. Eggs. Dairy: Low-fat milk. Cheese. Greek yogurt. Beverages: Water. Red wine. Herbal tea. Fats and oils: Extra virgin olive oil. Avocado oil. Grape seed oil. Sweets and desserts: [place] yogurt with honey. Baked apples. Poached pears. Trail mix. Seasoning and other foods: Basil. Cilantro. Coriander. Cumin. Mint. Parsley. Sage. Rosemary. Tarragon. Garlic. Oregano. Thyme. Pepper. Balsalmic vinegar. Tahini. Hummus. Tomato sauce. Olives. Mushrooms. Limit these Grains: Prepackaged pasta or rice dishes. Prepackaged cereal with added sugar. Vegetables: Deep fried potatoes (french fries). Fruits: Fruit canned in syrup. Meats and other protein foods: Beef. Pork. Lamb. Poultry with skin. Hot dogs. [place]. Dairy: Ice cream. Sour cream. Whole milk. Beverages: Juice. Sugar-sweetened soft drinks. Beer. Liquor and spirits. Fats and oils: Butter. Canola oil. Vegetable oil. Beef fat (tallow). Lard. Sweets and desserts: Cookies. Cakes. Pies. Candy. Seasoning and other foods: Mayonnaise. Premade sauces and marinades. The items listed may not be a complete list. Talk with your dietitian about what dietary choices  are right for you. Summary The Mediterranean diet includes both food and lifestyle choices. Eat a variety of fresh fruits and vegetables, beans, nuts, seeds, and whole grains. Limit the amount of red meat and sweets that you eat. Talk with your health care provider about whether it is safe for you to drink red wine in moderation. This means 1 glass a day for nonpregnant women and 2 glasses a day for men. A glass of wine equals 5 oz (150 mL). This information is not intended to replace advice given to you by your health care provider. Make sure you discuss any questions you have with your health care provider. Document Released: 04/14/2016 Document Revised: 05/17/2016 Document Reviewed: 04/14/2016 Elsevier Interactive Patient Education  2017 [employer].      [facility] [phone]. Labs today [address]

## 2024-02-29 NOTE — Progress Notes (Signed)
 Assessment/Plan:    Mild cognitive impairment, likely multifactorial  [name] is a very pleasant 73 y.o. RH male with a history of hypertension, hyperlipidemia, meningitis twice in 5 years, vitamin D deficiency, pre-diabetes, decreased hearing, anxiety and a diagnosis of mild cognitive impairment, likely multifactorial without strong evidence for Alzheimer's disease per neuropsych evaluation October 2024  presenting today in follow-up for evaluation of memory loss. His memory today is stable with MMSE is 30/30. Patient was on donepezil  10 mg daily then discontinuing it for unclear reasons. He expresses intentions to resume the medicine. Patient is able to participate on ADLs and to drive without difficulties. Mood is anxious, but improved from prior visit.       Recommendations:   Follow up in 6  months. Resume donepezil  10 mg daily, side effects discussed Continue B12, vitamin D supplements, follow-up with primary care provider Repeat neuropsych evaluation in 10 to 14 months for diagnostic clarity Recommend psychotherapy for anxiety Recommend good control of cardiovascular risk factors Continue to control mood as per PCP Recommend checking hearing    Subjective:   This patient is accompanied in the office by his wife who supplements the history. Previous records as well as any outside records available were reviewed prior to todays visit.   Patient was last seen on 09/20/23 .    Any changes in memory since last visit? About the same. As before, he has  issues with short-term than with [place]-term memory, especially with new information and recent conversations I have never been good with names .  He works 1.5 a week and a high stress environment, goal is to slowly retire one day . His wife reports that his hearing is an issue.  repeats oneself?  Endorsed Disoriented when walking into a room?  Patient denies   Misplacing objects?  Patient denies   Wandering behavior?    Denies. Any personality changes since last visit?  As prior, he does not know how to relax but he is trying -wife says.  Patient himself reports being OCD .   Any worsening depression?: denies.   Hallucinations or paranoia?  Denies.   Seizures?   Denies.    Any sleep changes?  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  Denies vivid dreams, REM behavior or sleepwalking   Sleep apnea?   denies    Any hygiene concerns?   Denies.   Independent of bathing and dressing?  Endorsed  Does the patient needs help with medications? Patient is in charge   Who is in charge of the finances?  Patient is in charge     Any changes in appetite?  denies     Patient have trouble swallowing?  Denies.   Does the patient cook?  Any kitchen accidents such as leaving the stove on?   Denies.   Any headaches?    Denies.   Vision changes? Denies. Chronic pain?  Denies.   Ambulates with difficulty?  He exercises with a trainer at home.  Him and his wife walk together frequently when weather permits.   Recent falls or head injuries?    Denies.      Unilateral weakness, numbness or tingling?  Denies.   Any tremors?  Denies.   Any anosmia?    Denies.   Any incontinence of urine?  Denies.   Any bowel dysfunction?  Denies.      Patient lives with his wife.  Does the patient drive?  Yes, he  denies getting lost   Initial visit 05/19/2023  How Prude did patient have memory difficulties? He denies any changes, but with the hearing loss he misses some of the conversation.  Patient reports some difficulty remembering new information, recent conversations, names (I always did). HE continues to work in a high stress environment.  repeats oneself?  Endorsed, not so bad. Disoriented when walking into a room?  Patient denies    Leaving objects in unusual places?  Denies.   Wandering behavior? Denies.   Any personality changes, or depression, anxiety? He was changing insurance and his anxiety meds were  not taken for a couple of months resulting in short temper. He doe s not know how to relax. His OCD is catching up on him-wife says.  Hallucinations or paranoia? Denies. Seizures? Denies.    Any sleep changes? Does not sleep well because he worries about the business. Denies vivid dreams, REM behavior or sleepwalking.   Sleep apnea? Denies.   Any hygiene concerns?  Denies.   Independent of bathing and dressing? Endorsed  Does the patient need help with medications? Patient is in charge   Who is in charge of the finances? Patient is in charge     Any changes in appetite?   Denies.     Patient have trouble swallowing?  Denies.   Does the patient cook? No  Any headaches?  Denies. Only when he had meningitis 2020 (bacterial from sinus infection),  2022 (viral)  Chronic pain? Denies.   Ambulates with difficulty? Denies. Exercises with a trainer at home.    Recent falls or head injuries? Denies.     Vision changes?   Has a history of cross eyed when he was a child,then  I was operated 6 times.  Any strokelike symptoms? Denies.   Any tremors? Denies.  Any anosmia? Denies.   Any incontinence of urine? Denies.   Any bowel dysfunction? Due to metformin he goes often.      Patient lives with wife History of heavy alcohol intake? Denies.   History of heavy tobacco use? Never. Family history of dementia?  No family with dementia  Does patient drive? Yes, denies any issues      He is in the trucking business.   MRI of the brain, personally reviewed, October 2024 remarkable for moderate chronic small vessel ischemic changes in the cerebral white matter, mild in the pons, mild to moderate generalized cerebral atrophy, mild cerebellar atrophy.   Labs September 2024 B12 366, TSH 1.25   Neuropsych evaluation 07/04/2023 briefly, results suggested isolated deficits across phonemic fluency and confrontation naming, as well as performance variability across verbal memory. Current testing patterns  do not offer strongly compelling evidence to suggest underlying Alzheimer's disease. This will remain important to monitor this over time to assess progressive decline versus ongoing stability. At the present time, the more likely explanation for current weaknesses across testing and day-to-day dysfunction would appear to be multifactorial in nature. Medically, Mr. Bollier has a history of two separate instances of meningitis (suspected bacterial in 2020 and viral in 2022), various cardiovascular ailments (i.e., hypertension, hyperlipidemia, type II diabetes), and prior neuroimaging suggesting moderate microvascular ischemic disease. All of which, especially when combined, can certainly create inefficiencies with encoding aspects of memory, as well as trouble with spontaneous information retrieval. Ongoing hearing loss, psychiatric distress surrounding anxiety, and various day-to-day stressors would certainly compound ongoing difficulties.   Past Medical History:  Diagnosis Date   Abnormal ECG 12/24/2014   Early  repolarization     Cerebrovascular disease    Moderate per MRI    Controlled type 2 diabetes mellitus without complication, without [place]-term current use of insulin  04/07/2014   Esophageal dysphagia 01/05/2018   Essential hypertension 05/21/2013   Generalized anxiety disorder 05/21/2013   w/ OCD tendencies   Hearing loss    History of adenomatous polyp of colon    Jaw pain    Left ear pain 12/01/2020   Lumbar spondylosis    Meningitis    2020-bacterial; 2022-viral   Mild cognitive impairment of uncertain or unknown etiology 07/04/2023   Mixed hyperlipidemia 05/21/2013   Pure hypercholesterolemia 01/05/2018   Sinusitis 10/06/2018   Squamous cell carcinoma of skin 09/05/2019   right posterior ear - tx after biopsy   Vitamin D deficiency      Past Surgical History:  Procedure Laterality Date   BACK SURGERY     COLONOSCOPY  07/21/2004, 04/03/2010   EYE MUSCLE REPAIR     HERNIA  REPAIR  1962   LUMBAR FUSION  04/17/2009   L4-L5   LUMBAR FUSION  1992   L4-L5   SKIN BIOPSY Right 09/05/2019   right posterior ear - tx after biopsy     PREVIOUS MEDICATIONS:   CURRENT MEDICATIONS:  Outpatient Encounter Medications as of 02/29/2024  Medication Sig   acetaminophen  (TYLENOL ) 500 MG tablet Take 1,000 mg by mouth 2 (two) times daily as needed for mild pain.   azelastine (ASTELIN) 0.1 % nasal spray Place 1 spray into both nostrils 2 (two) times daily.    cholecalciferol (VITAMIN D) 1000 UNITS tablet Take 1,000 Units by mouth daily.     FLUoxetine (PROZAC) 10 MG capsule Take 40 capsules by mouth daily.   LORazepam  (ATIVAN ) 0.5 MG tablet Take 0.5 mg by mouth at bedtime.   metFORMIN (GLUCOPHAGE) 500 MG tablet Take 500 mg by mouth 2 (two) times daily with a meal.   Multiple Vitamin (MULTIVITAMIN WITH MINERALS) TABS tablet Take 1 tablet by mouth daily.   rosuvastatin (CRESTOR) 10 MG tablet Take 10 mg by mouth daily.   cyanocobalamin  (VITAMIN B12) 1000 MCG tablet Take 1,000 mcg by mouth daily.   donepezil  (ARICEPT ) 10 MG tablet Take half tablet (5 mg) daily for 2 weeks, then increase to one  tablet at 10 mg daily   fluticasone (FLONASE) 50 MCG/ACT nasal spray Place 1 spray into both nostrils daily. (Patient not taking: Reported on 02/29/2024)   ibuprofen (ADVIL,MOTRIN) 800 MG tablet Take 800 mg by mouth 3 (three) times daily as needed for moderate pain. (Patient not taking: Reported on 02/29/2024)   omeprazole (PRILOSEC) 20 MG capsule Take 20 mg by mouth daily.   S-Adenosylmethionine (SAM-E) 400 MG TABS Take 1 tablet by mouth daily.   (Patient not taking: Reported on 02/29/2024)   [DISCONTINUED] donepezil  (ARICEPT ) 10 MG tablet Take half tablet (5 mg) daily for 2 weeks, then increase to one  tablet at 10 mg daily (Patient not taking: Reported on 02/29/2024)   No facility-administered encounter medications on file as of 02/29/2024.     Objective:     PHYSICAL EXAMINATION:     VITALS:   Vitals:   02/29/24 1052  BP: 114/86  Pulse: 75  Resp: 20  SpO2: 95%  Weight: 214 lb (97.1 kg)  Height: 5' 10 (1.778 m)    GEN:  The patient appears stated age and is in NAD. HEENT:  Normocephalic, atraumatic.   Neurological examination:  General: NAD, well-groomed, appears stated age. Orientation:  The patient is alert. Oriented to person, place and  to date.  Cranial nerves: There is good facial symmetry.The speech is fluent and clear. No aphasia or dysarthria. Fund of knowledge is appropriate. Recent memory and remote memory is normal.  Attention and concentration are reduced.  Able to name objects and repeat phrases.  Hearing is decreased to conversational tone.   Delayed recall 3/3 Sensation: Sensation is intact to light touch throughout Motor: Strength is at least antigravity x4. DTR's 2/4 in UE/LE      06/05/2023   12:00 PM  Montreal Cognitive Assessment   Visuospatial/ Executive (0/5) 0  Naming (0/3) 3  Attention: Read list of digits (0/2) 2  Attention: Read list of letters (0/1) 1  Attention: Serial 7 subtraction starting at 100 (0/3) 2  Language: Repeat phrase (0/2) 1  Language : Fluency (0/1) 1  Abstraction (0/2) 1  Delayed Recall (0/5) 5  Orientation (0/6) 6  Total 22  Adjusted Score (based on education) 22       02/29/2024    6:00 PM  MMSE - Mini Mental State Exam  Orientation to time 5  Orientation to Place 5  Registration 3  Attention/ Calculation 5  Recall 3  Language- name 2 objects 2  Language- repeat 1  Language- follow 3 step command 3  Language- read & follow direction 1  Write a sentence 1  Copy design 1  Total score 30       Movement examination: Tone: There is normal tone in the UE/LE Abnormal movements:  no tremor.  No myoclonus.  No asterixis.   Coordination:  There is no decremation with RAM's. Normal finger to nose  Gait and Station: The patient has no difficulty arising out of a deep-seated chair without the use of  the hands. The patient's stride length is good.  Gait is cautious and narrow.   Thank you for allowing us  the opportunity to participate in the care of this nice patient. Please do not hesitate to contact us  for any questions or concerns.   Total time spent on today's visit was 27 minutes dedicated to this patient today, preparing to see patient, examining the patient, ordering tests and/or medications and counseling the patient, documenting clinical information in the EHR or other health record, independently interpreting results and communicating results to the patient/family, discussing treatment and goals, answering patient's questions and coordinating care.  Cc:  Nena Cyndee DELENA DEVONNA Camie Dina 02/29/2024 6:36 PM

## 2024-03-19 ENCOUNTER — Ambulatory Visit: Payer: Medicare PPO | Admitting: Physician Assistant

## 2024-04-23 ENCOUNTER — Ambulatory Visit: Admitting: Physician Assistant

## 2024-04-24 ENCOUNTER — Encounter: Payer: BC Managed Care – PPO | Admitting: Psychology

## 2024-08-27 NOTE — Progress Notes (Signed)
 "   Mild Cognitive Impairment, likely multifactorial   [name] is a very pleasant 73 y.o. RH male with a history of hypertension, hyperlipidemia, meningitis twice in 5 years, vitamin D deficiency, pre-diabetes, decreased hearing, anxiety and a diagnosis of mild cognitive impairment, likely multifactorial without strong evidence for Alzheimer's disease per neuropsych evaluation October 2024  presenting today in follow-up for evaluation of memory loss. Patient is no longer on donepezil  10 mg daily due to bad dreams with the medicine. This patient is accompanied in the office by his wife  who supplements the history. Previous records as well as any outside records available were reviewed prior to todays visit.   Patient was last seen on 02/29/2024. Memory is stable. MMSE today is 30 /30. Patient is able to participate on ADLs and to drive without difficulties. Mood is anxious as prior, followed by PCP   Repeat neuropsych evaluation in 6 to 12 months for diagnostic clarity disease trajectory   Start Memantine  5 mg: Take 1 tablet (5 mg at night) for 2 weeks, then increase to 1 tablet (5 mg) twice a day, side effects discussed    Continue to control mood as per PCP Recommend good control of cardiovascular risk factors [place] up in 6 months      Discussed the use of AI scribe software for clinical note transcription with the patient, who gave verbal consent to proceed.  History of Present Illness [name] is a 73 year old male who presents for follow-up on cognitive function and medication management.  He continues to experience issues with memory, particularly short-term memory and organizational skills, which have remained stable since the last visit. He struggles with managing multiple projects, impacting his organizational abilities. He has a history of anxiety and obsessive-compulsive tendencies, which may contribute to his memory difficulties. Previously, he tried donepezil  but  discontinued it due to intense dreams, even when taking half a pill in the morning.  No depression, hallucinations, paranoia, or significant mood changes. Sleep has improved since the last visit, although he previously experienced disturbances due to business-related worries. He uses an [employer] to monitor sleep and exercise. He reports that his hearing is 'probably the worst' and has recently changed his hearing aids. He cleans his hearing aids regularly and is aware that hearing issues can impact memory retention.  He underwent cataract surgery in April, significantly improving his vision from 20/300 to 20/20. He has worn glasses since he was 88 months old. He continues to manage his own medications and finances and enjoys organizing them.  He is not currently exercising as much as he used to, partly due to an upcoming hip replacement in January. He used to exercise with a trainer and walk regularly. No signs or symptoms of a stroke, incontinence of urine or stools, driving difficulties, or major headaches.   Initial visit 05/19/2023  How [place] did patient have memory difficulties? He denies any changes, but with the hearing loss he misses some of the conversation.  Patient reports some difficulty remembering new information, recent conversations, names (I always did). HE continues to work in a high stress environment.  repeats oneself?  Endorsed, not so bad. Disoriented when walking into a room?  Patient denies    Leaving objects in unusual places?  Denies.   Wandering behavior? Denies.   Any personality changes, or depression, anxiety? He was changing insurance and his anxiety meds were not taken for a couple of months resulting in short temper. He doe  s not know how to relax. His OCD is catching up on him-wife says.  Hallucinations or paranoia? Denies. Seizures? Denies.    Any sleep changes? Does not sleep well because he worries about the business. Denies vivid dreams, REM behavior or  sleepwalking.   Sleep apnea? Denies.   Any hygiene concerns?  Denies.   Independent of bathing and dressing? Endorsed  Does the patient need help with medications? Patient is in charge   Who is in charge of the finances? Patient is in charge     Any changes in appetite?   Denies.     Patient have trouble swallowing?  Denies.   Does the patient cook? No  Any headaches?  Denies. Only when he had meningitis 2020 (bacterial from sinus infection),  2022 (viral)  Chronic pain? Denies.   Ambulates with difficulty? Denies. Exercises with a trainer at home.    Recent falls or head injuries? Denies.     Vision changes?   Has a history of cross eyed when he was a child,then  I was operated 6 times.  Any strokelike symptoms? Denies.   Any tremors? Denies.  Any anosmia? Denies.   Any incontinence of urine? Denies.   Any bowel dysfunction? Due to metformin he goes often.      Patient lives with wife History of heavy alcohol intake? Denies.   History of heavy tobacco use? Never. Family history of dementia?  No family with dementia  Does patient drive? Yes, denies any issues      He is in the trucking business.    MRI of the brain, personally reviewed, October 2024 remarkable for moderate chronic small vessel ischemic changes in the cerebral white matter, mild in the pons, mild to moderate generalized cerebral atrophy, mild cerebellar atrophy.   Labs September 2024 B12 366, TSH 1.25   Neuropsych evaluation 07/04/2023 briefly, results suggested isolated deficits across phonemic fluency and confrontation naming, as well as performance variability across verbal memory. Current testing patterns do not offer strongly compelling evidence to suggest underlying Alzheimer's disease. This will remain important to monitor this over time to assess progressive decline versus ongoing stability. At the present time, the more likely explanation for current weaknesses across testing and day-to-day dysfunction  would appear to be multifactorial in nature. Medically, [name] has a history of two separate instances of meningitis (suspected bacterial in 2020 and viral in 2022), various cardiovascular ailments (i.e., hypertension, hyperlipidemia, type II diabetes), and prior neuroimaging suggesting moderate microvascular ischemic disease. All of which, especially when combined, can certainly create inefficiencies with encoding aspects of memory, as well as trouble with spontaneous information retrieval. Ongoing hearing loss, psychiatric distress surrounding anxiety, and various day-to-day stressors would certainly compound ongoing difficulties.   Past Medical History:  Diagnosis Date   Abnormal ECG 12/24/2014   Early repolarization     Cerebrovascular disease    Moderate per MRI    Controlled type 2 diabetes mellitus without complication, without [place]-term current use of insulin  04/07/2014   Esophageal dysphagia 01/05/2018   Essential hypertension 05/21/2013   Generalized anxiety disorder 05/21/2013   w/ OCD tendencies   Hearing loss    History of adenomatous polyp of colon    Jaw pain    Left ear pain 12/01/2020   Lumbar spondylosis    Meningitis    2020-bacterial; 2022-viral   Mild cognitive impairment of uncertain or unknown etiology 07/04/2023   Mixed hyperlipidemia 05/21/2013   Pure hypercholesterolemia 01/05/2018   Sinusitis 10/06/2018  Squamous cell carcinoma of skin 09/05/2019   right posterior ear - tx after biopsy   Vitamin D deficiency      Past Surgical History:  Procedure Laterality Date   BACK SURGERY     COLONOSCOPY  07/21/2004, 04/03/2010   EYE MUSCLE REPAIR     HERNIA REPAIR  1962   LUMBAR FUSION  04/17/2009   L4-L5   LUMBAR FUSION  1992   L4-L5   SKIN BIOPSY Right 09/05/2019   right posterior ear - tx after biopsy         Objective:     PHYSICAL EXAMINATION:    VITALS:   Vitals:   08/28/24 0913  BP: 105/68  Pulse: 90  SpO2: 98%  Weight: 212 lb 6.4 oz  (96.3 kg)    GEN:  The patient appears stated age and is in NAD. HEENT:  Normocephalic, atraumatic.   Neurological examination:  General: NAD, well-groomed, appears stated age. Orientation: The patient is alert. Oriented to person, place and date.  Cranial nerves: There is good facial symmetry.The speech is fluent and clear. No aphasia or dysarthria. Fund of knowledge is appropriate. Recent memory impaired and remote memory is normal.  Attention and concentration are normal.  Able to name objects and repeat phrases.  Hearing is intact to conversational tone.   Delayed recall 3/3 Sensation: Sensation is intact to light touch throughout Motor: Strength is at least antigravity x4. DTR's 2/4 in UE/LE      06/05/2023   12:00 PM  Montreal Cognitive Assessment   Visuospatial/ Executive (0/5) 0  Naming (0/3) 3  Attention: Read list of digits (0/2) 2  Attention: Read list of letters (0/1) 1  Attention: Serial 7 subtraction starting at 100 (0/3) 2  Language: Repeat phrase (0/2) 1  Language : Fluency (0/1) 1  Abstraction (0/2) 1  Delayed Recall (0/5) 5  Orientation (0/6) 6  Total 22  Adjusted Score (based on education) 22       08/28/2024   12:00 PM 02/29/2024    6:00 PM  MMSE - Mini Mental State Exam  Orientation to time 5 5  Orientation to Place 5 5  Registration 3 3  Attention/ Calculation 5 5  Recall 3 3  Language- name 2 objects 2 2  Language- repeat 1 1  Language- follow 3 step command 3 3  Language- read & follow direction 1 1  Write a sentence 1 1  Copy design 1 1  Total score 30 30      Movement examination: Tone: There is normal tone in the UE/LE Abnormal movements:  no tremor.  No myoclonus.  No asterixis.   Coordination:  There is no decremation with RAM's. Normal finger to nose  Gait and Station: The patient has no difficulty arising out of a deep-seated chair without the use of the hands. The patient's stride length is good.  Gait is cautious and narrow.    Thank you for allowing us  the opportunity to participate in the care of this nice patient. Please do not hesitate to contact us  for any questions or concerns.   Total time spent on today's visit was 20 minutes dedicated to this patient today, preparing to see patient, examining the patient, ordering tests and/or medications and counseling the patient, documenting clinical information in the EHR or other health record, independently interpreting results and communicating results to the patient/family, discussing treatment and goals, answering patient's questions and coordinating care.  Cc:  Nena Cyndee LABOR, PA-C  [name]  [facility] 08/28/2024 12:36 PM      "

## 2024-08-28 ENCOUNTER — Ambulatory Visit: Admitting: Physician Assistant

## 2024-08-28 VITALS — BP 105/68 | HR 90 | Wt 212.4 lb

## 2024-08-28 DIAGNOSIS — G3184 Mild cognitive impairment, so stated: Secondary | ICD-10-CM

## 2024-08-28 DIAGNOSIS — R413 Other amnesia: Secondary | ICD-10-CM

## 2024-08-28 MED ORDER — MEMANTINE HCL 5 MG PO TABS: ORAL_TABLET | ORAL | 11 refills | Status: AC

## 2024-08-28 NOTE — Patient Instructions (Signed)
 It was a pleasure to see you today at our office.   Recommendations:   Follow up in 6 months  Start Memantine  5mg  tablets.  Take 1 tablet at bedtime for 2 weeks, then 1 tablet twice daily.   Side effects discussed  Continue using hearing aids Continue Replenish B12  at 1000 mcg daily    Counseling regarding caregiver distress, including caregiver depression, anxiety and issues regarding community resources, adult day care programs, adult living facilities, or memory care questions:  please contact your  Primary Doctor's Social Worker   Whom to call:v Memory  decline, memory medications: Call our office [phone]    [email or web address]   RECOMMENDATIONS FOR ALL PATIENTS WITH MEMORY PROBLEMS: 1. Continue to exercise (Recommend 30 minutes of walking everyday, or 3 hours every week) 2. Increase social interactions - continue going to [place] and enjoy social gatherings with friends and family 3. Eat healthy, avoid fried foods and eat more fruits and vegetables 4. Maintain adequate blood pressure, blood sugar, and blood cholesterol level. Reducing the risk of stroke and cardiovascular disease also helps promoting better memory. 5. Avoid stressful situations. Live a simple life and avoid aggravations. Organize your time and prepare for the next day in anticipation. 6. Sleep well, avoid any interruptions of sleep and avoid any distractions in the bedroom that may interfere with adequate sleep quality 7. Avoid sugar, avoid sweets as there is a strong link between excessive sugar intake, diabetes, and cognitive impairment We discussed the Mediterranean diet, which has been shown to help patients reduce the risk of progressive memory disorders and reduces cardiovascular risk. This includes eating fish, eat fruits and green leafy vegetables, nuts like almonds and hazelnuts, walnuts, and also use olive oil. Avoid fast foods and fried foods as  much as possible. Avoid sweets and sugar as sugar use has been linked to worsening of memory function.  There is always a concern of gradual progression of memory problems. If this is the case, then we may need to adjust level of care according to patient needs. Support, both to the patient and caregiver, should then be put into place.           DRIVING: Regarding driving, in patients with progressive memory problems, driving will be impaired. We advise to have someone else do the driving if trouble finding directions or if minor accidents are reported. Independent driving assessment is available to determine safety of driving.   If you are interested in the driving assessment, you can contact the following:  [facility] [employer] [facility] in [place] [phone]  [facility] [phone]  [facility] [phone]  [facility] [phone] or [phone]   FALL PRECAUTIONS: Be cautious when walking. Scan the area for obstacles that may increase the risk of trips and falls. When getting up in the mornings, sit up at the edge of the bed for a few minutes before getting out of bed. Consider elevating the bed at the head end to avoid drop of blood pressure when getting up. Walk always in a well-lit room (use night lights in the walls). Avoid area rugs or power cords from appliances in the middle of the walkways. Use a walker or a cane if necessary and consider physical therapy for balance exercise. Get your eyesight checked regularly.  FINANCIAL OVERSIGHT: Supervision, especially oversight when making financial decisions or transactions is also recommended.  HOME SAFETY: Consider the safety of the kitchen when operating appliances like stoves, microwave oven, and blender. Consider having supervision  and share cooking responsibilities until no longer able to participate in those. Accidents with firearms and other hazards in the house should be identified and  addressed as well.   ABILITY TO BE LEFT ALONE: If patient is unable to contact 911 operator, consider using LifeLine, or when the need is there, arrange for someone to stay with patients. Smoking is a fire hazard, consider supervision or cessation. Risk of wandering should be assessed by caregiver and if detected at any point, supervision and safe proof recommendations should be instituted.  MEDICATION SUPERVISION: Inability to self-administer medication needs to be constantly addressed. Implement a mechanism to ensure safe administration of the medications.      Mediterranean Diet A Mediterranean diet refers to food and lifestyle choices that are based on the traditions of countries located on the [employer] [place]. This way of eating has been shown to help prevent certain conditions and improve outcomes for people who have chronic diseases, like kidney disease and heart disease. What are tips for following this plan? Lifestyle  Cook and eat meals together with your family, when possible. Drink enough fluid to keep your urine clear or pale yellow. Be physically active every day. This includes: Aerobic exercise like running or swimming. Leisure activities like gardening, walking, or housework. Get 7-8 hours of sleep each night. If recommended by your health care provider, drink red wine in moderation. This means 1 glass a day for nonpregnant women and 2 glasses a day for men. A glass of wine equals 5 oz (150 mL). Reading food labels  Check the serving size of packaged foods. For foods such as rice and pasta, the serving size refers to the amount of cooked product, not dry. Check the total fat in packaged foods. Avoid foods that have saturated fat or trans fats. Check the ingredients list for added sugars, such as corn syrup. Shopping  At the grocery store, buy most of your food from the areas near the walls of the store. This includes: Fresh fruits and vegetables (produce). Grains,  beans, nuts, and seeds. Some of these may be available in unpackaged forms or large amounts (in bulk). Fresh seafood. Poultry and eggs. Low-fat dairy products. Buy whole ingredients instead of prepackaged foods. Buy fresh fruits and vegetables in-season from local farmers markets. Buy frozen fruits and vegetables in resealable bags. If you do not have access to quality fresh seafood, buy precooked frozen shrimp or canned fish, such as tuna, salmon, or sardines. Buy small amounts of raw or cooked vegetables, salads, or olives from the deli or salad bar at your store. Stock your pantry so you always have certain foods on hand, such as olive oil, canned tuna, canned tomatoes, rice, pasta, and beans. Cooking  Cook foods with extra-virgin olive oil instead of using butter or other vegetable oils. Have meat as a side dish, and have vegetables or grains as your main dish. This means having meat in small portions or adding small amounts of meat to foods like pasta or stew. Use beans or vegetables instead of meat in common dishes like chili or lasagna. Experiment with different cooking methods. Try roasting or broiling vegetables instead of steaming or sauteing them. Add frozen vegetables to soups, stews, pasta, or rice. Add nuts or seeds for added healthy fat at each meal. You can add these to yogurt, salads, or vegetable dishes. Marinate fish or vegetables using olive oil, lemon juice, garlic, and fresh herbs. Meal planning  Plan to eat 1 vegetarian meal one day  each week. Try to work up to 2 vegetarian meals, if possible. Eat seafood 2 or more times a week. Have healthy snacks readily available, such as: Vegetable sticks with hummus. Greek yogurt. Fruit and nut trail mix. Eat balanced meals throughout the week. This includes: Fruit: 2-3 servings a day Vegetables: 4-5 servings a day Low-fat dairy: 2 servings a day Fish, poultry, or lean meat: 1 serving a day Beans and legumes: 2 or more  servings a week Nuts and seeds: 1-2 servings a day Whole grains: 6-8 servings a day Extra-virgin olive oil: 3-4 servings a day Limit red meat and sweets to only a few servings a month What are my food choices? Mediterranean diet Recommended Grains: Whole-grain pasta. Brown rice. Bulgar wheat. Polenta. Couscous. Whole-wheat bread. [place] Madeira. Vegetables: Artichokes. Beets. Broccoli. Cabbage. Carrots. Eggplant. Green beans. Chard. Kale. Spinach. Onions. Leeks. Peas. Squash. Tomatoes. Peppers. Radishes. Fruits: Apples. Apricots. Avocado. Berries. Bananas. Cherries. Dates. Figs. Grapes. Lemons. Melon. Oranges. Peaches. Plums. Pomegranate. Meats and other protein foods: Beans. Almonds. Sunflower seeds. Pine nuts. Peanuts. Cod. Salmon. Scallops. Shrimp. Tuna. Tilapia. Clams. Oysters. Eggs. Dairy: Low-fat milk. Cheese. Greek yogurt. Beverages: Water. Red wine. Herbal tea. Fats and oils: Extra virgin olive oil. Avocado oil. Grape seed oil. Sweets and desserts: Greek yogurt with honey. Baked apples. Poached pears. Trail mix. Seasoning and other foods: Basil. Cilantro. Coriander. Cumin. Mint. Parsley. Sage. Rosemary. Tarragon. Garlic. Oregano. Thyme. Pepper. Balsalmic vinegar. Tahini. Hummus. Tomato sauce. Olives. Mushrooms. Limit these Grains: Prepackaged pasta or rice dishes. Prepackaged cereal with added sugar. Vegetables: Deep fried potatoes (french fries). Fruits: Fruit canned in syrup. Meats and other protein foods: Beef. Pork. Lamb. Poultry with skin. Hot dogs. [place]. Dairy: Ice cream. Sour cream. Whole milk. Beverages: Juice. Sugar-sweetened soft drinks. Beer. Liquor and spirits. Fats and oils: Butter. Canola oil. Vegetable oil. Beef fat (tallow). Lard. Sweets and desserts: Cookies. Cakes. Pies. Candy. Seasoning and other foods: Mayonnaise. Premade sauces and marinades. The items listed may not be a complete list. Talk with your dietitian about what dietary choices are right for  you. Summary The Mediterranean diet includes both food and lifestyle choices. Eat a variety of fresh fruits and vegetables, beans, nuts, seeds, and whole grains. Limit the amount of red meat and sweets that you eat. Talk with your health care provider about whether it is safe for you to drink red wine in moderation. This means 1 glass a day for nonpregnant women and 2 glasses a day for men. A glass of wine equals 5 oz (150 mL). This information is not intended to replace advice given to you by your health care provider. Make sure you discuss any questions you have with your health care provider. Document Released: 04/14/2016 Document Revised: 05/17/2016 Document Reviewed: 04/14/2016 Elsevier Interactive Patient Education  2017 [employer].      [facility] [phone]. Labs today [address]

## 2025-02-04 ENCOUNTER — Ambulatory Visit: Admitting: Physician Assistant

## 2025-02-26 ENCOUNTER — Ambulatory Visit: Payer: Self-pay | Admitting: Physician Assistant
